# Patient Record
Sex: Female | Born: 1990 | Race: White | Hispanic: No | Marital: Single | Smoking: Current every day smoker
Health system: Southern US, Community
[De-identification: ages and names within clinical notes are randomized; demographics above are authoritative.]

## PROBLEM LIST (undated history)

## (undated) DIAGNOSIS — F1120 Opioid dependence, uncomplicated: Secondary | ICD-10-CM

## (undated) DIAGNOSIS — B019 Varicella without complication: Secondary | ICD-10-CM

## (undated) DIAGNOSIS — F32A Depression, unspecified: Secondary | ICD-10-CM

## (undated) DIAGNOSIS — J45909 Unspecified asthma, uncomplicated: Secondary | ICD-10-CM

## (undated) DIAGNOSIS — F419 Anxiety disorder, unspecified: Secondary | ICD-10-CM

## (undated) DIAGNOSIS — K759 Inflammatory liver disease, unspecified: Secondary | ICD-10-CM

## (undated) DIAGNOSIS — F329 Major depressive disorder, single episode, unspecified: Secondary | ICD-10-CM

## (undated) DIAGNOSIS — N390 Urinary tract infection, site not specified: Secondary | ICD-10-CM

## (undated) DIAGNOSIS — I10 Essential (primary) hypertension: Secondary | ICD-10-CM

## (undated) DIAGNOSIS — A630 Anogenital (venereal) warts: Secondary | ICD-10-CM

## (undated) HISTORY — DX: Opioid dependence, uncomplicated: F11.20

## (undated) HISTORY — DX: Anxiety disorder, unspecified: F41.9

## (undated) HISTORY — DX: Unspecified asthma, uncomplicated: J45.909

## (undated) HISTORY — PX: OTHER SURGICAL HISTORY: SHX169

## (undated) HISTORY — DX: Urinary tract infection, site not specified: N39.0

## (undated) HISTORY — DX: Inflammatory liver disease, unspecified: K75.9

## (undated) HISTORY — DX: Essential (primary) hypertension: I10

## (undated) HISTORY — DX: Varicella without complication: B01.9

## (undated) HISTORY — DX: Depression, unspecified: F32.A

## (undated) HISTORY — DX: Major depressive disorder, single episode, unspecified: F32.9

## (undated) HISTORY — DX: Anogenital (venereal) warts: A63.0

---

## 2014-03-21 ENCOUNTER — Encounter: Payer: Self-pay | Admitting: Family

## 2014-03-25 ENCOUNTER — Ambulatory Visit: Payer: BLUE CROSS/BLUE SHIELD | Admitting: Family

## 2014-04-01 ENCOUNTER — Ambulatory Visit (INDEPENDENT_AMBULATORY_CARE_PROVIDER_SITE_OTHER): Payer: BLUE CROSS/BLUE SHIELD | Admitting: Family

## 2014-04-01 ENCOUNTER — Encounter: Payer: Self-pay | Admitting: Family

## 2014-04-01 VITALS — BP 130/98 | HR 110 | Temp 98.3°F | Resp 18 | Ht 62.0 in | Wt 169.1 lb

## 2014-04-01 DIAGNOSIS — L709 Acne, unspecified: Secondary | ICD-10-CM

## 2014-04-01 DIAGNOSIS — F5105 Insomnia due to other mental disorder: Secondary | ICD-10-CM

## 2014-04-01 DIAGNOSIS — F411 Generalized anxiety disorder: Secondary | ICD-10-CM

## 2014-04-01 DIAGNOSIS — F419 Anxiety disorder, unspecified: Secondary | ICD-10-CM | POA: Insufficient documentation

## 2014-04-01 DIAGNOSIS — K580 Irritable bowel syndrome with diarrhea: Secondary | ICD-10-CM | POA: Insufficient documentation

## 2014-04-01 MED ORDER — ALPRAZOLAM 0.5 MG PO TABS: 0.5000 mg | ORAL_TABLET | Freq: Two times a day (BID) | ORAL | Status: DC | PRN

## 2014-04-01 MED ORDER — ELUXADOLINE 100 MG PO TABS: 100.0000 mg | ORAL_TABLET | Freq: Two times a day (BID) | ORAL | Status: DC

## 2014-04-01 NOTE — Assessment & Plan Note (Signed)
Symptoms and exam consistent with generalized anxiety disorder which remains uncontrolled at this time. Patient is unsure of previous medications which she has tried. Start Xanax as needed. Follow-up in one month to determine anxiety control.

## 2014-04-01 NOTE — Progress Notes (Signed)
Pre visit review using our clinic review tool, if applicable. No additional management support is needed unless otherwise documented below in the visit note. 

## 2014-04-01 NOTE — Assessment & Plan Note (Signed)
Patient with history of acne and would like referral to dermatology. Referral placed.

## 2014-04-01 NOTE — Assessment & Plan Note (Signed)
Insomnia secondary to anxiety related to her racing mind. Treat with the same Xanax as prescribed for general anxiety disorder. Follow-up in one month.

## 2014-04-01 NOTE — Assessment & Plan Note (Signed)
Patient with previous history of irritable bowel syndrome with diarrhea. Patient indicates that she has tried all over-the-counter medications which provided no relief. She has a referral to gastroenterology made by her GYN. Start probiotic. Start eluxadoline as trial for IBS-D. Follow with GI or in this office as needed.

## 2014-04-01 NOTE — Patient Instructions (Addendum)
Thank you for choosing [facility].  Summary/Instructions:  Your prescription(s) have been submitted to your pharmacy or been printed and provided for you. Please take as directed and contact our office if you believe you are having problem(s) with the medication(s) or have any questions.  If your symptoms worsen or fail to improve, please contact our office for further instruction, or in case of emergency go directly to the emergency room at the closest medical facility.    Recommendations for improving sleep:   Avoid having pets sleep in the bedroom  Avoid caffeine consumption after 4pm  Keep bedroom cool and conducive to sleep  Avoid nicotine use, especially in the evening  Avoid exercise within 2-3 hours before bedtime  Stimulus Control:   Go to bed only when sleepy  Use the bedroom for sleep and sex only  Go to another room if you are unable to fall asleep within 15 to 20 minutes  Read or engage in other quiet activities and return to bed only when sleepy. 

## 2014-04-01 NOTE — Progress Notes (Signed)
Subjective:    Patient ID: Joyce Rivas, female    DOB: [birth year], 24 y.o.   MRN: [id number]  Chief Complaint  Patient presents with  . Establish Care    says she has anxiety, and insomnia, and IBS-D    HPI:  Joyce Rivas is a 24 y.o. female who presents today    1) Anxiety - Diagnosed with anxiety as a teenager and continues to experience the associated symptoms of anxiety and panic attacks. Indicates that her anxiety has increased lately secondary to positive life changes. Has previously been tried on Xanax 0.5 mg, which helped a lot. Does not recall which medications have been trialed.   2) Insomnia - Associated symptom of mind racing and anxiety keeps her up at night. This has been going on for years. Has tried melatonin which helps her get to sleep but not stay asleep.    3) IBS-D - Explains a long history of IBS-D for which her GYN has referred her to GI for further management. Indicates that she continues to experience the associated symptoms of diarrhea that is refractory to over the counter medications that she has tried and stomach cramping on occasion.    No Known Allergies   No current outpatient prescriptions on file prior to visit.   No current facility-administered medications on file prior to visit.    Past Medical History  Diagnosis Date  . Anxiety   . Depression   . Asthma   . Heroin addiction   . Chicken pox   . Genital warts   . Hepatitis   . Urinary tract infection     Past Surgical History  Procedure Laterality Date  . Wisdom teeth removal      Family History  Problem Relation Age of Onset  . Drug abuse Mother   . Kidney cancer Mother   . Bladder Cancer Mother   . Drug abuse Father   . Stroke Father   . Hypertension Father   . Arthritis Maternal Grandmother   . Diabetes Paternal Grandmother     History   Social History  . Marital Status: Single    Spouse Name: N/A  . Number of Children: 0  . Years of Education: 11   Occupational  History  . Unemployed    Social History Main Topics  . Smoking status: Former Smoker -- 1.00 packs/day for 10 years    Quit date: 02/16/2014  . Smokeless tobacco: Never Used  . Alcohol Use: 12.0 oz/week    0 Standard drinks or equivalent, 20 Cans of beer per week  . Drug Use: No  . Sexual Activity: Not on file   Other Topics Concern  . Not on file   Social History Narrative   Born in [place], [place] and raised in [place], [place]. Lives with her husband. No pets. Fun: Dance and sing.   Denies any religious beliefs effecting healthcare.     Review of Systems  Constitutional: Negative for fever, chills and appetite change.  Gastrointestinal: Positive for diarrhea. Negative for nausea, vomiting, blood in stool and abdominal distention.  Psychiatric/Behavioral: Positive for sleep disturbance. The patient is nervous/anxious.       Objective:    BP 130/98 mmHg  Pulse 110  Temp(Src) 98.3 F (36.8 C) (Oral)  Resp 18  Ht  (1.575 m)  Wt 169 lb 1.9 oz (76.712 kg)  BMI 30.92 kg/m2  SpO2 97% Nursing note and vital signs reviewed.  Physical Exam  Constitutional: She is oriented to  person, place, and time. She appears well-developed and well-nourished. No distress.  Cardiovascular: Normal rate, regular rhythm, normal heart sounds and intact distal pulses.   Pulmonary/Chest: Effort normal and breath sounds normal.  Abdominal: Normal appearance and bowel sounds are normal. She exhibits no ascites. There is no hepatosplenomegaly. There is no tenderness. There is no rigidity, no rebound, no guarding, no CVA tenderness, no tenderness at McBurney's point and negative Murphy's sign.  Neurological: She is alert and oriented to person, place, and time.  Skin: Skin is warm and dry.  Psychiatric: She has a normal mood and affect. Her behavior is normal. Judgment and thought content normal.       Assessment & Plan:

## 2014-04-16 ENCOUNTER — Other Ambulatory Visit: Payer: Self-pay | Admitting: Internal Medicine

## 2014-04-29 ENCOUNTER — Ambulatory Visit (INDEPENDENT_AMBULATORY_CARE_PROVIDER_SITE_OTHER): Payer: BLUE CROSS/BLUE SHIELD | Admitting: Family

## 2014-04-29 ENCOUNTER — Encounter: Payer: Self-pay | Admitting: Family

## 2014-04-29 VITALS — BP 128/88 | HR 112 | Temp 97.5°F | Resp 18 | Wt 165.0 lb

## 2014-04-29 DIAGNOSIS — B001 Herpesviral vesicular dermatitis: Secondary | ICD-10-CM | POA: Diagnosis not present

## 2014-04-29 DIAGNOSIS — F411 Generalized anxiety disorder: Secondary | ICD-10-CM

## 2014-04-29 MED ORDER — ALBUTEROL SULFATE HFA 108 (90 BASE) MCG/ACT IN AERS: 2.0000 | INHALATION_SPRAY | Freq: Four times a day (QID) | RESPIRATORY_TRACT | Status: DC | PRN

## 2014-04-29 MED ORDER — LIDOCAINE VISCOUS 2 % MT SOLN: 20.0000 mL | OROMUCOSAL | Status: DC | PRN

## 2014-04-29 MED ORDER — ALPRAZOLAM 1 MG PO TABS: 0.5000 mg | ORAL_TABLET | Freq: Two times a day (BID) | ORAL | Status: DC | PRN

## 2014-04-29 MED ORDER — VENLAFAXINE HCL ER 37.5 MG PO CP24: 37.5000 mg | ORAL_CAPSULE | Freq: Every day | ORAL | Status: DC

## 2014-04-29 MED ORDER — VALACYCLOVIR HCL 1 G PO TABS: 1000.0000 mg | ORAL_TABLET | Freq: Two times a day (BID) | ORAL | Status: DC

## 2014-04-29 NOTE — Assessment & Plan Note (Signed)
Anxiety appears to be exacerbated by patient's current situation and previous history. Denies suicidal ideations. Increase Xanax to 1 mg twice a day as needed. Start venlafaxine daily. Follow-up in one month.

## 2014-04-29 NOTE — Progress Notes (Signed)
Pre visit review using our clinic review tool, if applicable. No additional management support is needed unless otherwise documented below in the visit note. 

## 2014-04-29 NOTE — Progress Notes (Addendum)
   Subjective:    Patient ID: Joyce Rivas, female    DOB: Oct 07, 1990, 24 y.o.   MRN: [id number]  Chief Complaint  Patient presents with  . Mouth Lesions    multiple cold sores, using OTC with no relief    HPI:  Joyce Rivas is a 24 y.o. female who presents today for an acute visit.   1) Cold Sores - This is a new problem. Associated symptom of cold sore located in her mouth on the posterior aspect of her lip has been going on for about week. She has tried OTC medication which has provided some relief but is not doing a lot. Notes that when she took the lip piercing out noticed there was pus coming out.   2) Anxiety - Previously started on 0.5 mg of Xanax which she indicates does help however she indicates that her anxiety has worsened and she has been more tearful lately. She would like to increase her xanax and a referral to psychology.    No Known Allergies  Current Outpatient Prescriptions on File Prior to Visit  Medication Sig Dispense Refill  . albuterol (PROVENTIL) (2.5 MG/3ML) 0.083% nebulizer solution Take 2.5 mg by nebulization every 6 (six) hours as needed for wheezing or shortness of breath.    . ALPRAZolam (XANAX) 0.5 MG tablet Take 1 tablet (0.5 mg total) by mouth 2 (two) times daily as needed for anxiety. 60 tablet 0  . flurbiprofen (ANSAID) 100 MG tablet TAKE 1 TWICE DAILY UNTIL COMPLETE 20 tablet 0  . Eluxadoline (VIBERZI) 100 MG TABS Take 100 mg by mouth 2 (two) times daily. 60 tablet 2   No current facility-administered medications on file prior to visit.     Review of Systems  Constitutional: Negative for fever and chills.  HENT: Positive for mouth sores.   Psychiatric/Behavioral: Positive for dysphoric mood. Negative for suicidal ideas. The patient is nervous/anxious.       Objective:    BP 128/88 mmHg  Pulse 112  Temp(Src) 97.5 F (36.4 C) (Oral)  Resp 18  Wt 165 lb (74.844 kg)  SpO2 99% Nursing note and vital signs reviewed.  Physical Exam    Constitutional: She is oriented to person, place, and time. She appears well-developed and well-nourished. No distress.  HENT:  Half inch diameter oval, appears raw with cracking, reddened at the base on the posterior aspect of her lip  Cardiovascular: Normal rate, regular rhythm, normal heart sounds and intact distal pulses.   Pulmonary/Chest: Effort normal and breath sounds normal.  Neurological: She is alert and oriented to person, place, and time.  Skin: Skin is warm and dry.  Psychiatric: Her speech is normal and behavior is normal. Judgment and thought content normal. Her mood appears anxious. She exhibits a depressed mood.       Assessment & Plan:

## 2014-04-29 NOTE — Assessment & Plan Note (Signed)
Symptoms and exam consistent with cold sore. No other evidence of inflammation at present. Start lidocaine solution as needed for pain. Cannot rule out potential HSV-1. Patient indicates her GYN has already tested but she is unsure of the results. Prescription for Valtrex given if needed.

## 2014-04-29 NOTE — Patient Instructions (Signed)
Thank you for choosing [facility].  Summary/Instructions:  Your prescription(s) have been submitted to your pharmacy or been printed and provided for you. Please take as directed and contact our office if you believe you are having problem(s) with the medication(s) or have any questions.  If your symptoms worsen or fail to improve, please contact our office for further instruction, or in case of emergency go directly to the emergency room at the closest medical facility.     

## 2014-05-13 ENCOUNTER — Ambulatory Visit (INDEPENDENT_AMBULATORY_CARE_PROVIDER_SITE_OTHER): Payer: BLUE CROSS/BLUE SHIELD | Admitting: Family

## 2014-05-13 ENCOUNTER — Encounter: Payer: Self-pay | Admitting: Family

## 2014-05-13 ENCOUNTER — Telehealth: Payer: Self-pay | Admitting: Family

## 2014-05-13 VITALS — BP 140/98 | HR 110 | Temp 97.6°F | Resp 18 | Ht 62.0 in | Wt 164.1 lb

## 2014-05-13 DIAGNOSIS — F411 Generalized anxiety disorder: Secondary | ICD-10-CM | POA: Diagnosis not present

## 2014-05-13 DIAGNOSIS — K580 Irritable bowel syndrome with diarrhea: Secondary | ICD-10-CM | POA: Diagnosis not present

## 2014-05-13 DIAGNOSIS — B001 Herpesviral vesicular dermatitis: Secondary | ICD-10-CM | POA: Diagnosis not present

## 2014-05-13 MED ORDER — ACYCLOVIR 400 MG PO TABS: 400.0000 mg | ORAL_TABLET | Freq: Two times a day (BID) | ORAL | Status: DC

## 2014-05-13 MED ORDER — ALPRAZOLAM 1 MG PO TABS: 1.0000 mg | ORAL_TABLET | Freq: Two times a day (BID) | ORAL | Status: DC | PRN

## 2014-05-13 MED ORDER — ELUXADOLINE 100 MG PO TABS: 100.0000 mg | ORAL_TABLET | Freq: Two times a day (BID) | ORAL | Status: DC

## 2014-05-13 NOTE — Telephone Encounter (Signed)
Ok to refill 

## 2014-05-13 NOTE — Progress Notes (Signed)
Pre visit review using our clinic review tool, if applicable. No additional management support is needed unless otherwise documented below in the visit note. 

## 2014-05-13 NOTE — Assessment & Plan Note (Signed)
Patient continues to experience some diarrhea from her irritable bowel syndrome. Prescription for Viberzi provided to patient. Follow up in 1 month or sooner if needed.

## 2014-05-13 NOTE — Assessment & Plan Note (Signed)
Patient continues to experience anxiety and an unsuccessful trial of Effexor. Discontinue Effexor. Continue Xanax at this time. Patient wishes to see psychiatry to discuss her anxiety and depression and for also possible other trials of medication. Controlled substance contract signed. Urine drug screen performed with results pending. Follow-up in one month or sooner if needed.

## 2014-05-13 NOTE — Progress Notes (Signed)
   Subjective:    Patient ID: Joyce Rivas, female    DOB: 1990-05-16, 24 y.o.   MRN: [id number]  Chief Complaint  Patient presents with  . Medication follow up    Referral to psychiatrist, wants to know if she can get a refill of xanax and put on a daily of medication for cold sores, wants IBS medicine, low energy     HPI:  Joyce Rivas is a 24 y.o. female who presents today for medication follow up.  1) Irritable bowel syndrome - previously prescribed Viberzi for her IBS-D. Secondary to mixup patient was unable to obtain the medication. Indicates that her IBS has been improved , however would like to try the Viberzi.  2) Anxiety - previously prescribed Effexor to assist with anxiety and depression. Patient indicates she had weird feelings on the medication and discontinued it. She continues to take the Xanax as needed for anxiety, which she indicates is just about twice a day. She is requesting referral to psychiatry and to cancel the psychology referral that was previously made. Indicates she has several things that she would like to talk to them about.  3) Sores on Mouth - completed prescription of Valtrex, which she indicates helps tremendously. Also use lidocaine solution which allowed her to eat. She would like to obtain a cyclical fever as a preventative measure as she tends to get outbreaks fairly frequently.  No Known Allergies   Current Outpatient Prescriptions on File Prior to Visit  Medication Sig Dispense Refill  . albuterol (PROVENTIL HFA;VENTOLIN HFA) 108 (90 BASE) MCG/ACT inhaler Inhale 2 puffs into the lungs every 6 (six) hours as needed for wheezing or shortness of breath. 1 Inhaler 10  . flurbiprofen (ANSAID) 100 MG tablet TAKE 1 TWICE DAILY UNTIL COMPLETE 20 tablet 0  . lidocaine (XYLOCAINE) 2 % solution Use as directed 20 mLs in the mouth or throat as needed for mouth pain. 100 mL 0  . Omeprazole (PRILOSEC PO) Take by mouth daily.    . ranitidine (ZANTAC) 150 MG tablet  Take 150 mg by mouth 2 (two) times daily.     No current facility-administered medications on file prior to visit.    Review of Systems  Constitutional: Negative for fever and chills.  HENT: Negative for mouth sores.   Gastrointestinal: Positive for diarrhea. Negative for abdominal pain.  Psychiatric/Behavioral: Positive for decreased concentration. Negative for dysphoric mood. The patient is nervous/anxious.       Objective:    BP 140/98 mmHg  Pulse 110  Temp(Src) 97.6 F (36.4 C) (Oral)  Resp 18  Ht 5\' 2"  (1.575 m)  Wt 164 lb 1.9 oz (74.444 kg)  BMI 30.01 kg/m2  SpO2 99% Nursing note and vital signs reviewed.  Physical Exam  Constitutional: She is oriented to person, place, and time. She appears well-developed and well-nourished. No distress.  Cardiovascular: Normal rate, regular rhythm, normal heart sounds and intact distal pulses.   Pulmonary/Chest: Effort normal and breath sounds normal.  Abdominal: Soft. Bowel sounds are normal. She exhibits no distension and no mass. There is no tenderness. There is no rebound and no guarding.  Neurological: She is alert and oriented to person, place, and time.  Skin: Skin is warm and dry.  Psychiatric: Her behavior is normal. Judgment and thought content normal. Her mood appears anxious.       Assessment & Plan:

## 2014-05-13 NOTE — Telephone Encounter (Signed)
Seen in office today  

## 2014-05-13 NOTE — Patient Instructions (Signed)
Thank you for choosing [facility].  Summary/Instructions:  Your prescription(s) have been submitted to your pharmacy or been printed and provided for you. Please take as directed and contact our office if you believe you are having problem(s) with the medication(s) or have any questions.  If your symptoms worsen or fail to improve, please contact our office for further instruction, or in case of emergency go directly to the emergency room at the closest medical facility.     

## 2014-05-13 NOTE — Assessment & Plan Note (Addendum)
Cold sores resolved with Valtrex. Per patient request start acyclovir for suppressive effects. Continue with Valtrex as needed for outbreaks. Follow up if symptoms worsen or fail to improve.

## 2014-05-13 NOTE — Telephone Encounter (Signed)
Patient is requesting a refill for flurbiprofen (ANSAID) 100 MG tablet [[id number]][[id number]] sent to [facility] @ [address]

## 2014-05-27 ENCOUNTER — Telehealth: Payer: Self-pay | Admitting: Family

## 2014-05-27 NOTE — Telephone Encounter (Signed)
Patient needs refill for ALPRAZolam Joyce Rivas(XANAX) 1 MG tablet [[id number]. Does she need to come in for an appointment for this since she was just her on the 4/13?

## 2014-05-28 MED ORDER — ALPRAZOLAM 1 MG PO TABS: 1.0000 mg | ORAL_TABLET | Freq: Two times a day (BID) | ORAL | Status: DC | PRN

## 2014-05-28 NOTE — Telephone Encounter (Signed)
Medication refilled and faxed to pharmacy.  

## 2014-05-29 ENCOUNTER — Other Ambulatory Visit: Payer: Self-pay | Admitting: Family

## 2014-05-29 MED ORDER — ALPRAZOLAM 1 MG PO TABS: 1.0000 mg | ORAL_TABLET | Freq: Two times a day (BID) | ORAL | Status: DC | PRN

## 2014-06-01 ENCOUNTER — Encounter: Payer: Self-pay | Admitting: Family

## 2014-06-22 ENCOUNTER — Other Ambulatory Visit: Payer: Self-pay | Admitting: Internal Medicine

## 2014-06-23 ENCOUNTER — Telehealth: Payer: Self-pay | Admitting: Family

## 2014-06-23 NOTE — Telephone Encounter (Signed)
Pt is going out of town Friday and would like to pick her script for her ALPRAZolam Prudy Feeler(XANAX) 1 MG tablet [[id number]][[id number]] .Marland Kitchen.  They always have to call for an override which delays it. She said that she needs no later then Friday

## 2014-06-23 NOTE — Telephone Encounter (Signed)
Faxed

## 2014-06-23 NOTE — Telephone Encounter (Signed)
[facility]ed [facility].../lmb 

## 2014-07-22 ENCOUNTER — Other Ambulatory Visit: Payer: Self-pay | Admitting: Internal Medicine

## 2014-07-22 MED ORDER — ALPRAZOLAM 1 MG PO TABS: 1.0000 mg | ORAL_TABLET | Freq: Two times a day (BID) | ORAL | Status: DC | PRN

## 2014-07-22 NOTE — Telephone Encounter (Signed)
Joyce Rivas, your patient. [employer]

## 2014-07-22 NOTE — Telephone Encounter (Signed)
Please advise, thanks.

## 2014-08-05 ENCOUNTER — Telehealth: Payer: Self-pay | Admitting: Family

## 2014-08-05 NOTE — Telephone Encounter (Signed)
Just FYI - Received call from Dawn @ [facility] stating they have tried to call patient multiple times with no response from patient. They are mailing a letter to pt. °

## 2014-08-05 NOTE — Telephone Encounter (Signed)
Noted  

## 2014-08-21 ENCOUNTER — Telehealth: Payer: Self-pay

## 2014-08-21 MED ORDER — ALPRAZOLAM 1 MG PO TABS: 1.0000 mg | ORAL_TABLET | Freq: Two times a day (BID) | ORAL | Status: DC | PRN

## 2014-08-21 NOTE — Telephone Encounter (Signed)
Requesting refill for xanax 1 mg

## 2014-08-21 NOTE — Telephone Encounter (Signed)
Medication printed and faxed. Please inform patient OV needed for next refill.

## 2014-08-24 NOTE — Telephone Encounter (Signed)
LVM letting pt know.  

## 2014-08-31 ENCOUNTER — Other Ambulatory Visit: Payer: Self-pay | Admitting: Family

## 2014-09-04 ENCOUNTER — Telehealth: Payer: Self-pay | Admitting: *Deleted

## 2014-09-04 MED ORDER — ELUXADOLINE 100 MG PO TABS: 1.0000 | ORAL_TABLET | Freq: Two times a day (BID) | ORAL | Status: DC

## 2014-09-04 NOTE — Telephone Encounter (Signed)
Pt states [facility] is [facility]. [facility]form [facility].../lmb

## 2014-09-09 ENCOUNTER — Telehealth: Payer: Self-pay | Admitting: *Deleted

## 2014-09-09 MED ORDER — ELUXADOLINE 100 MG PO TABS: 1.0000 | ORAL_TABLET | Freq: Two times a day (BID) | ORAL | Status: DC

## 2014-09-09 NOTE — Telephone Encounter (Signed)
Prescription written and will be faxed.

## 2014-09-09 NOTE — Telephone Encounter (Signed)
Notified pt Joyce Rivas script [facility].../lmb

## 2014-09-09 NOTE — Telephone Encounter (Signed)
Pt states her insurance will only allow her to get a 90 day on her meds. Requesting new script for 90 day on her [facility]rez [facility]

## 2014-09-24 ENCOUNTER — Other Ambulatory Visit: Payer: Self-pay | Admitting: Family

## 2014-09-24 NOTE — Telephone Encounter (Signed)
[facility]ed [facility] bck to [facility]...Georgiann Mccoy

## 2014-09-24 NOTE — Telephone Encounter (Signed)
Medication refilled

## 2014-10-28 ENCOUNTER — Other Ambulatory Visit: Payer: Self-pay | Admitting: Internal Medicine

## 2014-10-29 NOTE — Telephone Encounter (Signed)
Last OV 05/13/14. Please advise.

## 2014-10-29 NOTE — Telephone Encounter (Signed)
Needs office visit please

## 2014-11-02 ENCOUNTER — Other Ambulatory Visit: Payer: Self-pay | Admitting: Family

## 2014-11-02 MED ORDER — ALPRAZOLAM 1 MG PO TABS: 1.0000 mg | ORAL_TABLET | Freq: Two times a day (BID) | ORAL | Status: DC | PRN

## 2014-11-09 ENCOUNTER — Ambulatory Visit: Payer: BLUE CROSS/BLUE SHIELD | Admitting: Family

## 2014-11-09 DIAGNOSIS — Z0289 Encounter for other administrative examinations: Secondary | ICD-10-CM

## 2014-11-10 ENCOUNTER — Ambulatory Visit (INDEPENDENT_AMBULATORY_CARE_PROVIDER_SITE_OTHER): Payer: PRIVATE HEALTH INSURANCE | Admitting: Family

## 2014-11-10 ENCOUNTER — Encounter: Payer: Self-pay | Admitting: Family

## 2014-11-10 VITALS — BP 112/80 | HR 101 | Temp 98.1°F | Resp 18 | Ht 62.0 in | Wt 151.8 lb

## 2014-11-10 DIAGNOSIS — F411 Generalized anxiety disorder: Secondary | ICD-10-CM

## 2014-11-10 DIAGNOSIS — K120 Recurrent oral aphthae: Secondary | ICD-10-CM | POA: Insufficient documentation

## 2014-11-10 DIAGNOSIS — K580 Irritable bowel syndrome with diarrhea: Secondary | ICD-10-CM | POA: Diagnosis not present

## 2014-11-10 MED ORDER — ELUXADOLINE 100 MG PO TABS: 1.0000 | ORAL_TABLET | Freq: Two times a day (BID) | ORAL | Status: DC

## 2014-11-10 MED ORDER — LIDOCAINE VISCOUS 2 % MT SOLN: 20.0000 mL | OROMUCOSAL | Status: DC | PRN

## 2014-11-10 MED ORDER — ACYCLOVIR 400 MG PO TABS: 400.0000 mg | ORAL_TABLET | Freq: Two times a day (BID) | ORAL | Status: DC

## 2014-11-10 MED ORDER — ALPRAZOLAM 1 MG PO TABS: 1.0000 mg | ORAL_TABLET | Freq: Two times a day (BID) | ORAL | Status: DC | PRN

## 2014-11-10 NOTE — Progress Notes (Signed)
Pre visit review using our clinic review tool, if applicable. No additional management support is needed unless otherwise documented below in the visit note. 

## 2014-11-10 NOTE — Assessment & Plan Note (Signed)
Anxiety is stable with current regimen of Xanax and Topamax. Established with psychiatry and psychology. Reports stressors are well controlled. Continue current dosage of Xanax and follow up with Behavioral Health as scheduled.

## 2014-11-10 NOTE — Progress Notes (Signed)
Subjective:    Patient ID: Joyce Rivas, female    DOB: 1990/05/01, 24 y.o.   MRN: [id number]  Chief Complaint  Patient presents with  . Medication Management    thinks she has an infection in her mouth and would like an antibiotic if possible, refill for xanax    HPI:  Joyce Rivas is a 24 y.o. female who  has a past medical history of Anxiety; Depression; Asthma; Heroin addiction (HCC); Chicken pox; Genital warts; Hepatitis; and Urinary tract infection. and presents today for a follow up office visit.   1.) Anxiety - Anxiety is well controlled with Xanax. Also being seen by a psychiatrist (Dr. Manson Passey, - [facility]). Takes the medication as prescribed and denies adverse side effects. Has also been started on Topamax. Currently takes the Xanax twice daily.   2.) IBS - Currently maintained on Viberzi which she indicates adequately controls her symptoms in combination with her diet. Has lost about 15 pounds since last visit and reports she is feeling better. Takes one pill daily and denies adverse side effects.  Wt Readings from Last 3 Encounters:  11/10/14 151 lb 12.8 oz (68.856 kg)  05/13/14 164 lb 1.9 oz (74.444 kg)  04/29/14 165 lb (74.844 kg)    3.) Oral infection - Associated symptom of a cold sore has been going on for about 1 week. Modifying factors include the Acyclovir which helped to improve it. Continues to experience gum bleeding. Described as sores in her mouth.    No Known Allergies   Current Outpatient Prescriptions on File Prior to Visit  Medication Sig Dispense Refill  . albuterol (PROVENTIL HFA;VENTOLIN HFA) 108 (90 BASE) MCG/ACT inhaler Inhale 2 puffs into the lungs every 6 (six) hours as needed for wheezing or shortness of breath. 1 Inhaler 10  . flurbiprofen (ANSAID) 100 MG tablet TAKE 1 TWICE DAILY UNTIL COMPLETE 20 tablet 0  . Omeprazole (PRILOSEC PO) Take by mouth daily.    . ranitidine (ZANTAC) 150 MG tablet Take 150 mg by mouth 2  (two) times daily.     No current facility-administered medications on file prior to visit.    Past Medical History  Diagnosis Date  . Anxiety   . Depression   . Asthma   . Heroin addiction (HCC)   . Chicken pox   . Genital warts   . Hepatitis   . Urinary tract infection      Past Surgical History  Procedure Laterality Date  . Wisdom teeth removal       Review of Systems  Constitutional: Negative for fever and chills.  Gastrointestinal: Negative for nausea, vomiting, abdominal pain, diarrhea and constipation.  Psychiatric/Behavioral: Negative for sleep disturbance, dysphoric mood and decreased concentration. The patient is not nervous/anxious.       Objective:    BP 112/80 mmHg  Pulse 101  Temp(Src) 98.1 F (36.7 C) (Oral)  Resp 18  Ht  (1.575 m)  Wt 151 lb 12.8 oz (68.856 kg)  BMI 27.76 kg/m2  SpO2 98% Nursing note and vital signs reviewed.  Physical Exam  Constitutional: She is oriented to person, place, and time. She appears well-developed and well-nourished. No distress.  HENT:  3 distinct white annular areas located on the upper left anterior aspect of her gums with mild red bases and no discharge present.   Cardiovascular: Normal rate, regular rhythm, normal heart sounds and intact distal pulses.   Pulmonary/Chest: Effort normal and breath sounds normal.  Neurological: She is  alert and oriented to person, place, and time.  Skin: Skin is warm and dry.  Psychiatric: She has a normal mood and affect. Her behavior is normal. Judgment and thought content normal.       Assessment & Plan:   Problem List Items Addressed This Visit      Digestive   Irritable bowel syndrome with diarrhea    Well controlled with current regimen of once daily Viberzi. Denies adverse side effects. Continue current dosage of Viberzi.       Relevant Medications   Eluxadoline (VIBERZI) 100 MG TABS   Canker sore - Primary    Symptoms and exam consistent with canker sores.  Continue conservative treatment with lidocaine as needed. Follow up if symptoms worsen or fail to improve.       Relevant Medications   lidocaine (XYLOCAINE) 2 % solution     Other   Generalized anxiety disorder    Anxiety is stable with current regimen of Xanax and Topamax. Established with psychiatry and psychology. Reports stressors are well controlled. Continue current dosage of Xanax and follow up with Behavioral Health as scheduled.       Relevant Medications   ALPRAZolam (XANAX) 1 MG tablet

## 2014-11-10 NOTE — Patient Instructions (Signed)
Thank you for choosing [facility].  Summary/Instructions:  Continue to take your medications as prescribed.  Your prescription(s) have been submitted to your pharmacy or been printed and provided for you. Please take as directed and contact our office if you believe you are having problem(s) with the medication(s) or have any questions.  If your symptoms worsen or fail to improve, please contact our office for further instruction, or in case of emergency go directly to the emergency room at the closest medical facility.     

## 2014-11-10 NOTE — Assessment & Plan Note (Signed)
Well controlled with current regimen of once daily Viberzi. Denies adverse side effects. Continue current dosage of Viberzi.

## 2014-11-10 NOTE — Assessment & Plan Note (Signed)
Symptoms and exam consistent with canker sores. Continue conservative treatment with lidocaine as needed. Follow up if symptoms worsen or fail to improve.

## 2015-01-29 ENCOUNTER — Telehealth: Payer: Self-pay | Admitting: Family

## 2015-01-29 DIAGNOSIS — F411 Generalized anxiety disorder: Secondary | ICD-10-CM

## 2015-01-29 MED ORDER — ALPRAZOLAM 1 MG PO TABS: 1.0000 mg | ORAL_TABLET | Freq: Two times a day (BID) | ORAL | Status: DC | PRN

## 2015-01-29 NOTE — Telephone Encounter (Signed)
Please advise. Last refill was 10/11

## 2015-01-29 NOTE — Telephone Encounter (Signed)
Medication refilled and faxed.  

## 2015-01-29 NOTE — Telephone Encounter (Signed)
Pt called [facility] Please help, pt was trying to get this done before the weekend. Please help

## 2015-03-01 ENCOUNTER — Other Ambulatory Visit: Payer: Self-pay | Admitting: Family

## 2015-03-01 NOTE — Telephone Encounter (Signed)
Last refill was 01/29/15 

## 2015-03-31 ENCOUNTER — Other Ambulatory Visit: Payer: Self-pay | Admitting: Family

## 2015-03-31 NOTE — Telephone Encounter (Signed)
Please advise, thanks.

## 2015-04-07 ENCOUNTER — Other Ambulatory Visit: Payer: Self-pay

## 2015-04-07 ENCOUNTER — Telehealth: Payer: Self-pay

## 2015-04-07 MED ORDER — ACYCLOVIR 400 MG PO TABS: 400.0000 mg | ORAL_TABLET | Freq: Two times a day (BID) | ORAL | Status: DC

## 2015-04-07 NOTE — Telephone Encounter (Signed)
Ok to refill for 30 days and then office visit is needed.

## 2015-04-07 NOTE — Telephone Encounter (Signed)
i recd faxed rx request for acyclovir 400 mg tab----are you ok with another refill or do you need OV---patient saw you in oct/2016 and you gave 5 refills then---was just thinking patient might need follow if she is requesting more refills at this point---please advise, thanks

## 2015-04-07 NOTE — Telephone Encounter (Signed)
rx sent to patient pharm

## 2015-04-30 ENCOUNTER — Other Ambulatory Visit: Payer: Self-pay | Admitting: Family

## 2015-04-30 NOTE — Telephone Encounter (Signed)
[facility] 

## 2015-05-05 ENCOUNTER — Telehealth: Payer: Self-pay | Admitting: Family

## 2015-05-05 MED ORDER — ALPRAZOLAM 1 MG PO TABS: ORAL_TABLET | ORAL | Status: DC
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
## 2015-05-05 NOTE — Telephone Encounter (Signed)
Please advise 
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
## 2015-05-05 NOTE — Addendum Note (Signed)
Addended by: Jeanine LuzALONE, GREGORY D on: 05/05/2015 05:24 PM   Modules accepted: Orders

## 2015-05-05 NOTE — Telephone Encounter (Signed)
Medication printed and faxed to [facility].

## 2015-05-06 NOTE — Telephone Encounter (Signed)
Called pharmacy and gave ok to refill early.

## 2015-05-06 NOTE — Telephone Encounter (Signed)
Can you call [facility] they need verificaton  from greg that it is ok to fill early

## 2015-05-10 ENCOUNTER — Ambulatory Visit (INDEPENDENT_AMBULATORY_CARE_PROVIDER_SITE_OTHER): Payer: BLUE CROSS/BLUE SHIELD | Admitting: Family

## 2015-05-10 ENCOUNTER — Encounter: Payer: Self-pay | Admitting: Family

## 2015-05-10 VITALS — BP 108/68 | HR 105 | Temp 98.1°F | Resp 18 | Ht 62.0 in | Wt 147.8 lb

## 2015-05-10 DIAGNOSIS — K580 Irritable bowel syndrome with diarrhea: Secondary | ICD-10-CM | POA: Diagnosis not present

## 2015-05-10 DIAGNOSIS — F411 Generalized anxiety disorder: Secondary | ICD-10-CM | POA: Diagnosis not present

## 2015-05-10 NOTE — Assessment & Plan Note (Signed)
Symptoms of irritable bowel syndrome are well maintained with current dosage of eluxadoline without adverse side effects or abdominal pain. Continue current dosage of eluxadoline. Follow up if symptoms are no longer controlled or worsen.
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
## 2015-05-10 NOTE — Progress Notes (Signed)
Pre visit review using our clinic review tool, if applicable. No additional management support is needed unless otherwise documented below in the visit note. 
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
## 2015-05-10 NOTE — Patient Instructions (Addendum)
Thank you for choosing [facility].  Summary/Instructions:  Please continue to take your medication as prescribed. Follow up in 6 months or sooner if needed.   Your prescription(s) have been submitted to your pharmacy or been printed and provided for you. Please take as directed and contact our office if you believe you are having problem(s) with the medication(s) or have any questions.  If your symptoms worsen or fail to improve, please contact our office for further instruction, or in case of emergency go directly to the emergency room at the closest medical facility.     

## 2015-05-12 ENCOUNTER — Ambulatory Visit: Payer: PRIVATE HEALTH INSURANCE | Admitting: Family

## 2015-06-04 ENCOUNTER — Other Ambulatory Visit: Payer: Self-pay | Admitting: Family

## 2015-06-04 NOTE — Telephone Encounter (Signed)
Last refill was 05/05/15

## 2015-06-07 NOTE — Telephone Encounter (Signed)
[facility]...lmb 

## 2015-06-07 NOTE — Telephone Encounter (Signed)
Received call pt requesting status of  Refill...Joyce Rivas/lmb

## 2015-06-11 ENCOUNTER — Emergency Department (HOSPITAL_COMMUNITY)
Admission: EM | Admit: 2015-06-11 | Discharge: 2015-06-11 | Disposition: A | Discharge status: Home / Self Care | Payer: BLUE CROSS/BLUE SHIELD | Attending: Emergency Medicine | Admitting: Emergency Medicine

## 2015-06-11 ENCOUNTER — Encounter (HOSPITAL_COMMUNITY): Payer: Self-pay | Admitting: Emergency Medicine

## 2015-06-11 DIAGNOSIS — Z87891 Personal history of nicotine dependence: Secondary | ICD-10-CM | POA: Insufficient documentation

## 2015-06-11 DIAGNOSIS — Z79899 Other long term (current) drug therapy: Secondary | ICD-10-CM | POA: Insufficient documentation

## 2015-06-11 DIAGNOSIS — Y9389 Activity, other specified: Secondary | ICD-10-CM | POA: Diagnosis not present

## 2015-06-11 DIAGNOSIS — F419 Anxiety disorder, unspecified: Secondary | ICD-10-CM | POA: Insufficient documentation

## 2015-06-11 DIAGNOSIS — Y9289 Other specified places as the place of occurrence of the external cause: Secondary | ICD-10-CM | POA: Diagnosis not present

## 2015-06-11 DIAGNOSIS — Z8619 Personal history of other infectious and parasitic diseases: Secondary | ICD-10-CM | POA: Insufficient documentation

## 2015-06-11 DIAGNOSIS — J45909 Unspecified asthma, uncomplicated: Secondary | ICD-10-CM | POA: Insufficient documentation

## 2015-06-11 DIAGNOSIS — Z8719 Personal history of other diseases of the digestive system: Secondary | ICD-10-CM | POA: Insufficient documentation

## 2015-06-11 DIAGNOSIS — T40601A Poisoning by unspecified narcotics, accidental (unintentional), initial encounter: Secondary | ICD-10-CM

## 2015-06-11 DIAGNOSIS — F329 Major depressive disorder, single episode, unspecified: Secondary | ICD-10-CM | POA: Insufficient documentation

## 2015-06-11 DIAGNOSIS — Y998 Other external cause status: Secondary | ICD-10-CM | POA: Diagnosis not present

## 2015-06-11 DIAGNOSIS — T401X1A Poisoning by heroin, accidental (unintentional), initial encounter: Secondary | ICD-10-CM | POA: Diagnosis not present

## 2015-06-11 DIAGNOSIS — Z87440 Personal history of urinary (tract) infections: Secondary | ICD-10-CM | POA: Insufficient documentation

## 2015-06-11 NOTE — Discharge Instructions (Signed)
Accidental Overdose °A drug overdose occurs when a chemical substance (drug or medication) is used in amounts large enough to overcome a person. This may result in severe illness or death. This is a type of poisoning. Accidental overdoses of medications or other substances come from a variety of reasons. When this happens accidentally, it is often because the person taking the substance does not know enough about what they have taken. Drugs which commonly cause overdose deaths are alcohol, psychotropic medications (medications which affect the mind), pain medications, illegal drugs (street drugs) such as cocaine and heroin, and multiple drugs taken at the same time. It may result from careless behavior (such as over-indulging at a party). Other causes of overdose may include multiple drug use, a lapse in memory, or drug use after a period of no drug use.  °Sometimes overdosing occurs because a person cannot remember if they have taken their medication.  °A common unintentional overdose in young children involves multi-vitamins containing iron. Iron is a part of the hemoglobin molecule in blood. It is used to transport oxygen to living cells. When taken in small amounts, iron allows the body to restock hemoglobin. In large amounts, it causes problems in the body. If this overdose is not treated, it can lead to death. °Never take medicines that show signs of tampering or do not seem quite right. Never take medicines in the dark or in poor lighting. Read the label and check each dose of medicine before you take it. When adults are poisoned, it happens most often through carelessness or lack of information. Taking medicines in the dark or taking medicine prescribed for someone else to treat the same type of problem is a dangerous practice. °SYMPTOMS  °Symptoms of overdose depend on the medication and amount taken. They can vary from over-activity with stimulant over-dosage, to sleepiness from depressants such as  alcohol, narcotics and tranquilizers. Confusion, dizziness, nausea and vomiting may be present. If problems are severe enough coma and death may result. °DIAGNOSIS  °Diagnosis and management are generally straightforward if the drug is known. Otherwise it is more difficult. At times, certain symptoms and signs exhibited by the patient, or blood tests, can reveal the drug in question.  °TREATMENT  °In an emergency department, most patients can be treated with supportive measures. Antidotes may be available if there has been an overdose of opioids or benzodiazepines. A rapid improvement will often occur if this is the cause of overdose. °At home or away from medical care: °· There may be no immediate problems or warning signs in children. °· Not everything works well in all cases of poisoning. °· Take immediate action. Poisons may act quickly. °· If you think someone has swallowed medicine or a household product, and the person is unconscious, having seizures (convulsions), or is not breathing, immediately call for an ambulance. °IF a person is conscious and appears to be doing OK but has swallowed a poison: °· Do not wait to see what effect the poison will have. Immediately call a poison control center (listed in the white pages of your telephone book under "Poison Control" or inside the front cover with other emergency numbers). Some poison control centers have TTY capability for the deaf. Check with your local center if you or someone in your family requires this service. °· Keep the container so you can read the label on the product for ingredients. °· Describe what, when, and how much was taken and the age and condition of the person poisoned.   Inform them if the person is vomiting, choking, drowsy, shows a change in color or temperature of skin, is conscious or unconscious, or is convulsing. °· Do not cause vomiting unless instructed by medical personnel. Do not induce vomiting or force liquids into a person who  is convulsing, unconscious, or very drowsy. °Stay calm and in control.  °· Activated charcoal also is sometimes used in certain types of poisoning and you may wish to add a supply to your emergency medicines. It is available without a prescription. Call a poison control center before using this medication. °PREVENTION  °Thousands of children die every year from unintentional poisoning. This may be from household chemicals, poisoning from carbon monoxide in a car, taking their parent's medications, or simply taking a few iron pills or vitamins with iron. Poisoning comes from unexpected sources. °· Store medicines out of the sight and reach of children, preferably in a locked cabinet. Do not keep medications in a food cabinet. Always store your medicines in a secure place. Get rid of expired medications. °· If you have children living with you or have them as occasional guests, you should have child-resistant caps on your medicine containers. Keep everything out of reach. Child proof your home. °· If you are called to the telephone or to answer the door while you are taking a medicine, take the container with you or put the medicine out of the reach of small children. °· Do not take your medication in front of children. Do not tell your child how good a medication is and how good it is for them. They may get the idea it is more of a treat. °· If you are an adult and have accidentally taken an overdose, you need to consider how this happened and what can be done to prevent it from happening again. If this was from a street drug or alcohol, determine if there is a problem that needs addressing. If you are not sure a problems exists, it is easy to talk to a professional and ask them if they think you have a problem. It is better to handle this problem in this way before it happens again and has a much worse consequence. °  °This information is not intended to replace advice given to you by your health care provider. Make  sure you discuss any questions you have with your health care provider. °  °Document Released: 04/01/2004 Document Revised: 02/06/2014 Document Reviewed: 07/06/2014 °Elsevier Interactive Patient Education ©2016 [employer] ° °Narcotic Overdose °A narcotic overdose is the misuse or overuse of a narcotic drug. A narcotic overdose can make you pass out and stop breathing. If you are not treated right away, this can cause permanent brain damage or stop your heart. Medicine may be given to reverse the effects of an overdose. If so, this medicine may bring on withdrawal symptoms. The symptoms may be abdominal cramps, throwing up (vomiting), sweating, chills, and nervousness. °Injecting narcotics can cause more problems than just an overdose. AIDS, hepatitis, and other very serious infections are transmitted by sharing needles and syringes. If you decide to quit using, there are medicines which can help you through the withdrawal period. Trying to quit all at once on your own can be uncomfortable, but not life-threatening. Call your caregiver, Narcotics Anonymous, or any drug and alcohol treatment program for further help.  °  °This information is not intended to replace advice given to you by your health care provider. Make sure you discuss any questions   you have with your health care provider. °  °Document Released: 02/24/2004 Document Revised: 02/06/2014 Document Reviewed: 07/08/2014 °Elsevier Interactive Patient Education ©2016 Elsevier Inc. ° °

## 2015-06-11 NOTE — ED Notes (Addendum)
Pt from home via EMS- Per EMS, pt was last seen 20 minutes prior to being found by BF. BF reports cyanosis, no respiration or pulse and started CPR. EMS arrived, continued CPR and was given 2 IN narcan w/o response. Pt received 2 more narcan IN and started to respond. Pt admits to snorting heroin prior to being found. Pt is A&O and on her cellphone. Dr Freida BusmanAllen at bedside

## 2015-06-11 NOTE — ED Provider Notes (Signed)
CSN: [id number]     Arrival date & time 06/11/15  1716 History   First MD Initiated Contact with Patient 06/11/15 1724     Chief Complaint  Patient presents with  . Drug Overdose     (Consider location/radiation/quality/duration/timing/severity/associated sxs/prior Treatment) HPI Comments: Patient here after intentionally ingesting heroin with the purpose of intoxication. Denies of this was a suicide attempt. Was found by her boyfriend and she was apneic and cyanotic. She received CPR and was given a total of 4 mg of Narcan intranasal by EMS. States that she has been clean for some time and this was the first time that she has used heroin. She takes Xanax daily. Denies alcohol use. She denies being short of breath at this time.  Patient is a 25 y.o. female presenting with Overdose. The history is provided by the patient and the EMS personnel.  Drug Overdose    Past Medical History  Diagnosis Date  . Anxiety   . Depression   . Asthma   . Heroin addiction (HCC)   . Chicken pox   . Genital warts   . Hepatitis   . Urinary tract infection    Past Surgical History  Procedure Laterality Date  . Wisdom teeth removal     Family History  Problem Relation Age of Onset  . Drug abuse Mother   . Kidney cancer Mother   . Bladder Cancer Mother   . Drug abuse Father   . Stroke Father   . Hypertension Father   . Arthritis Maternal Grandmother   . Diabetes Paternal Grandmother    Social History  Substance Use Topics  . Smoking status: Former Smoker -- 1.00 packs/day for 10 years    Quit date: 02/16/2014  . Smokeless tobacco: Never Used  . Alcohol Use: 12.0 oz/week    0 Standard drinks or equivalent, 20 Cans of beer per week   OB History    No data available     Review of Systems  All other systems reviewed and are negative.     Allergies  Review of patient's allergies indicates no known allergies.  Home Medications   Prior to Admission medications   Medication Sig  Start Date End Date Taking? Authorizing Provider  acyclovir (ZOVIRAX) 400 MG tablet Take 1 tablet (400 mg total) by mouth 2 (two) times daily. 04/07/15   Veryl SpeakGregory D Calone, FNP  albuterol (PROVENTIL HFA;VENTOLIN HFA) 108 (90 BASE) MCG/ACT inhaler Inhale 2 puffs into the lungs every 6 (six) hours as needed for wheezing or shortness of breath. 04/29/14   Veryl SpeakGregory D Calone, FNP  ALPRAZolam Prudy Feeler(XANAX) 1 MG tablet TAKE 1 TABLET BY MOUTH TWICE DAILY AS NEEDED FOR ANXIETY 06/07/15   Veryl SpeakGregory D Calone, FNP  Eluxadoline (VIBERZI) 100 MG TABS Take 100 mg by mouth daily.    Historical Provider, MD  topiramate (TOPAMAX) 200 MG tablet TAKE 1 TABLET BY MOUTH EVERY DAY 04/30/15   Veryl SpeakGregory D Calone, FNP   SpO2 100% Physical Exam  Constitutional: She is oriented to person, place, and time. She appears well-developed and well-nourished.  Non-toxic appearance. No distress.  HENT:  Head: Normocephalic and atraumatic.  Eyes: Conjunctivae, EOM and lids are normal. Pupils are equal, round, and reactive to light.  Neck: Normal range of motion. Neck supple. No tracheal deviation present. No thyroid mass present.  Cardiovascular: Normal rate, regular rhythm and normal heart sounds.  Exam reveals no gallop.   No murmur heard. Pulmonary/Chest: Effort normal and breath sounds normal. No stridor.  No respiratory distress. She has no decreased breath sounds. She has no wheezes. She has no rhonchi. She has no rales.  Abdominal: Soft. Normal appearance and bowel sounds are normal. She exhibits no distension. There is no tenderness. There is no rebound and no CVA tenderness.  Musculoskeletal: Normal range of motion. She exhibits no edema or tenderness.  Neurological: She is alert and oriented to person, place, and time. She has normal strength. No cranial nerve deficit or sensory deficit. GCS eye subscore is 4. GCS verbal subscore is 5. GCS motor subscore is 6.  Skin: Skin is warm and dry. No abrasion and no rash noted.  Psychiatric: She has  a normal mood and affect. Her speech is normal and behavior is normal.  Nursing note and vitals reviewed.   ED Course  Procedures (including critical care time) Labs Review Labs Reviewed - No data to display  Imaging Review No results found. I have personally reviewed and evaluated these images and lab results as part of my medical decision-making.   EKG Interpretation None      MDM   Final diagnoses:  None    Patient monitored here and she has no signs of respiratory distress. Mild tachycardic likely anxiety. Patient requesting to go home    Lorre Nick, MD 06/11/15 [phone]

## 2015-06-11 NOTE — ED Notes (Signed)
Bed: RESB Expected date:  Expected time:  Means of arrival:  Comments: EMS- heroin OD 

## 2015-09-03 ENCOUNTER — Ambulatory Visit (INDEPENDENT_AMBULATORY_CARE_PROVIDER_SITE_OTHER): Payer: Self-pay | Admitting: Nurse Practitioner

## 2015-09-03 VITALS — BP 108/72 | HR 72 | Temp 99.1°F | Ht 62.0 in | Wt 133.6 lb

## 2015-09-03 DIAGNOSIS — L237 Allergic contact dermatitis due to plants, except food: Secondary | ICD-10-CM

## 2015-09-03 MED ORDER — METHYLPREDNISOLONE ACETATE 80 MG/ML IJ SUSP
80.0000 mg | Freq: Once | INTRAMUSCULAR | Status: AC
  Administered 2015-09-03: 80 mg via INTRAMUSCULAR

## 2015-09-03 MED ORDER — PREDNISONE 10 MG (21) PO TBPK: ORAL_TABLET | ORAL | 0 refills | Status: DC

## 2015-09-03 NOTE — Patient Instructions (Addendum)

## 2015-09-03 NOTE — Progress Notes (Signed)
Subjective:     Joyce Rivas is a 25 y.o. female who presents for evaluation of a rash involving the ear, face, hand and chest and lower back.. Rash started 1 day ago. The rash is linear and erythematous, and raised in texture. Rash has spread changed over time. Rash is pruritic. Associated symptoms: nausea and low grade temperature. Patient denies: abdominal pain, congestion, cough, decrease in appetite, decrease in energy level, sore throat and vomiting. Patient has had contacts with similar rash. Patient has had new exposures (soaps, lotions, laundry detergents, foods, medications, plants, insects or animals).  The following portions of the patient's history were reviewed and updated as appropriate: past family history, past medical history, past social history, past surgical history and problem list.  Review of Systems Pertinent items noted in HPI and remainder of comprehensive ROS otherwise negative.    Objective:    BP 108/72 (BP Location: Right Arm, Patient Position: Sitting, Cuff Size: Normal)   Pulse 72   Temp 99.1 F (37.3 C) (Oral)   Ht 5\' 2"  (1.575 m)   Wt 133 lb 9.6 oz (60.6 kg)   BMI 24.44 kg/m  General:  alert, cooperative and mild distress  Skin:  erythema noted on face, trunk and ears, chest, upper extremities and neck.     Assessment:    contact dermatitis: plants poison ivy    Plan:    The patient was prescribed a Prednisone Tapered Dose Pack.  Verbal and written discharge instructions provided to patient to include use of OTC antihistamines and oatmeal bath for comfort.  The patient was instructed to wash all clothing, bedding, etc that was exposed.  The patient will follow up with PCP if no improvement in 3-5 days.

## 2015-09-15 ENCOUNTER — Encounter: Payer: Self-pay | Admitting: Family

## 2015-09-15 ENCOUNTER — Ambulatory Visit (INDEPENDENT_AMBULATORY_CARE_PROVIDER_SITE_OTHER): Payer: BLUE CROSS/BLUE SHIELD | Admitting: Family

## 2015-09-15 DIAGNOSIS — L237 Allergic contact dermatitis due to plants, except food: Secondary | ICD-10-CM | POA: Diagnosis not present

## 2015-09-15 MED ORDER — PREDNISONE 20 MG PO TABS: ORAL_TABLET | ORAL | 0 refills | Status: DC

## 2015-09-15 NOTE — Patient Instructions (Signed)
Thank you for choosing [facility].  Summary/Instructions:  Please take the prednisone.   Your prescription(s) have been submitted to your pharmacy or been printed and provided for you. Please take as directed and contact our office if you believe you are having problem(s) with the medication(s) or have any questions.  If your symptoms worsen or fail to improve, please contact our office for further instruction, or in case of emergency go directly to the emergency room at the closest medical facility.

## 2015-09-15 NOTE — Assessment & Plan Note (Signed)
Rash consistent with possible rebound from previous poison ivy although cannot fully rule out possibility of scabies given the locations of lesions.  Start prednisone taper to treat for posion ivy and follow up if symptoms do not improve.

## 2015-09-15 NOTE — Progress Notes (Signed)
Subjective:    Patient ID: Joyce Rivas, female    DOB: 27-Nov-1990, 25 y.o.   MRN: [id number]  Chief Complaint  Patient presents with  . Rash    since she had poison ivy she has been getting welps on her hands that itch that pop up randomly    HPI:  Joyce Rivas is a 25 y.o. female who  has a past medical history of Anxiety; Asthma; Chicken pox; Depression; Genital warts; Hepatitis; Heroin addiction (HCC); and Urinary tract infection. and presents today for an acute office visit.  This is a new problem. Recently diagnosed with poison ivy dermatitis and treated with an injection of methlyprednisilone and a prednisone dose pack. Reports taking the medication as prescribed and denies adverse side effects. She continues to have occasional welps located on her hands. Continues to experience a mild residual rash. Modifying factors include hydrocortisone cream which has helped a little.   No Known Allergies   Current Outpatient Prescriptions on File Prior to Visit  Medication Sig Dispense Refill  . acyclovir (ZOVIRAX) 400 MG tablet Take 1 tablet (400 mg total) by mouth 2 (two) times daily. 60 tablet 0  . albuterol (PROVENTIL HFA;VENTOLIN HFA) 108 (90 BASE) MCG/ACT inhaler Inhale 2 puffs into the lungs every 6 (six) hours as needed for wheezing or shortness of breath. 1 Inhaler 10  . Eluxadoline (VIBERZI) 100 MG TABS Take 100 mg by mouth 2 (two) times daily.     . propranolol (INDERAL) 10 MG tablet Take 10 mg by mouth 3 (three) times daily.    . QUEtiapine Fumarate (SEROQUEL XR) 150 MG 24 hr tablet Take 150 mg by mouth at bedtime.    . sertraline (ZOLOFT) 25 MG tablet Take 25 mg by mouth daily.    Marland Kitchen. topiramate (TOPAMAX) 200 MG tablet TAKE 1 TABLET BY MOUTH EVERY DAY 30 tablet 2   No current facility-administered medications on file prior to visit.     Review of Systems  Constitutional: Negative for chills and fever.  Respiratory: Negative for chest tightness and shortness of breath.   Skin:  Positive for rash.      Objective:    BP 122/82 (BP Location: Left Arm, Patient Position: Sitting, Cuff Size: Normal)   Pulse (!) 107   Temp 98 F (36.7 C) (Oral)   Resp 16   Ht 5\' 2"  (1.575 m)   Wt 132 lb 12.8 oz (60.2 kg)   SpO2 98%   BMI 24.29 kg/m  Nursing note and vital signs reviewed.  Physical Exam  Constitutional: She is oriented to person, place, and time. She appears well-developed and well-nourished. No distress.  Cardiovascular: Normal rate, regular rhythm, normal heart sounds and intact distal pulses.   Pulmonary/Chest: Effort normal and breath sounds normal.  Neurological: She is alert and oriented to person, place, and time.  Skin: Skin is warm and dry.  Small vesicles noted around wrist and hands and sporadically over bilateral upper extremities.   Psychiatric: She has a normal mood and affect. Her behavior is normal. Judgment and thought content normal.       Assessment & Plan:   Problem List Items Addressed This Visit      Musculoskeletal and Integument   Poison ivy    Rash consistent with possible rebound from previous poison ivy although cannot fully rule out possibility of scabies given the locations of lesions.  Start prednisone taper to treat for posion ivy and follow up if symptoms do not improve.  Relevant Medications   predniSONE (DELTASONE) 20 MG tablet    Other Visit Diagnoses   None.      I have discontinued Ms. Wolak's predniSONE. I am also having her start on predniSONE. Additionally, I am having her maintain her albuterol, acyclovir, topiramate, Eluxadoline, sertraline, QUEtiapine Fumarate, propranolol, and naltrexone.   Meds ordered this encounter  Medications  . naltrexone (DEPADE) 50 MG tablet    Sig: Take 50 mg by mouth daily.  . predniSONE (DELTASONE) 20 MG tablet    Sig: Take 3 tablets by mouth daily for 4 days, then 2 tablets by mouth daily for 4 days then 1 tablet by mouth daily for 4 days.    Dispense:  24 tablet     Refill:  0    Order Specific Question:   Supervising Provider    Answer:   Hillard DankerRAWFORD, ELIZABETH A [[id number]]     Follow-up: Return if symptoms worsen or fail to improve.  Jeanine Luzalone, Gregory, FNP

## 2015-11-09 ENCOUNTER — Encounter: Payer: Self-pay | Admitting: Family

## 2015-11-09 ENCOUNTER — Ambulatory Visit (INDEPENDENT_AMBULATORY_CARE_PROVIDER_SITE_OTHER): Payer: BLUE CROSS/BLUE SHIELD | Admitting: Family

## 2015-11-09 VITALS — BP 108/78 | HR 90 | Temp 98.2°F | Resp 16 | Ht 62.0 in | Wt 131.8 lb

## 2015-11-09 DIAGNOSIS — L709 Acne, unspecified: Secondary | ICD-10-CM

## 2015-11-09 DIAGNOSIS — F411 Generalized anxiety disorder: Secondary | ICD-10-CM

## 2015-11-09 DIAGNOSIS — Z23 Encounter for immunization: Secondary | ICD-10-CM

## 2015-11-09 MED ORDER — ERYTHROMYCIN 2 % EX GEL
Freq: Two times a day (BID) | CUTANEOUS | 1 refills | Status: DC
Start: 2015-11-09 — End: 2016-07-19

## 2015-11-09 MED ORDER — TRETINOIN 0.01 % EX GEL: Freq: Every day | CUTANEOUS | 0 refills | Status: DC

## 2015-11-09 NOTE — Assessment & Plan Note (Signed)
Continues to experience acne that is refractory to over-the-counter medications and treatments. Start tretinoin and erythromycin. Information about acne provided after visit summary. If symptoms worsen or do not improve consider referral to dermatology.

## 2015-11-09 NOTE — Progress Notes (Signed)
Subjective:    Patient ID: Joyce Rivas, female    DOB: 1990/05/12, 25 y.o.   MRN: [id number]  Chief Complaint  Patient presents with  . Follow-up    rx for acne?     HPI:  Joyce Rivas is a 25 y.o. female who  has a past medical history of Anxiety; Asthma; Chicken pox; Depression; Genital warts; Hepatitis; Heroin addiction (HCC); and Urinary tract infection. and presents today for an office follow up.  1.) Acne - Associated symptom of acne has been going on for several months that has been refractory to Clean and Clear and Proactiv. Described as uncomfortable. Does wear make-up on a regular basis and removed with make-up wipes.   2.) Anxiety - Currently maintained on propranolol and Zoloft. Reports taking the medication as prescribed and denies adverse side effects. Anxiety is generally well controlled but does experience occasional symptoms.  No Known Allergies    Outpatient Medications Prior to Visit  Medication Sig Dispense Refill  . acyclovir (ZOVIRAX) 400 MG tablet Take 1 tablet (400 mg total) by mouth 2 (two) times daily. 60 tablet 0  . albuterol (PROVENTIL HFA;VENTOLIN HFA) 108 (90 BASE) MCG/ACT inhaler Inhale 2 puffs into the lungs every 6 (six) hours as needed for wheezing or shortness of breath. 1 Inhaler 10  . Eluxadoline (VIBERZI) 100 MG TABS Take 100 mg by mouth 2 (two) times daily.     . naltrexone (DEPADE) 50 MG tablet Take 50 mg by mouth daily.    . propranolol (INDERAL) 10 MG tablet Take 10 mg by mouth 3 (three) times daily.    . QUEtiapine Fumarate (SEROQUEL XR) 150 MG 24 hr tablet Take 150 mg by mouth at bedtime.    . sertraline (ZOLOFT) 25 MG tablet Take 25 mg by mouth daily.    Marland Kitchen topiramate (TOPAMAX) 200 MG tablet TAKE 1 TABLET BY MOUTH EVERY DAY 30 tablet 2  . predniSONE (DELTASONE) 20 MG tablet Take 3 tablets by mouth daily for 4 days, then 2 tablets by mouth daily for 4 days then 1 tablet by mouth daily for 4 days. 24 tablet 0   No facility-administered  medications prior to visit.      Review of Systems  Constitutional: Negative for chills and fever.  Respiratory: Negative for chest tightness and shortness of breath.   Cardiovascular: Negative for chest pain.  Skin:       Positive for acne.   Psychiatric/Behavioral: Negative for dysphoric mood and suicidal ideas. The patient is not nervous/anxious.       Objective:    BP 108/78 (BP Location: Left Arm, Patient Position: Sitting, Cuff Size: Normal)   Pulse 90   Temp 98.2 F (36.8 C) (Oral)   Resp 16   Ht 5\' 2"  (1.575 m)   Wt 131 lb 12.8 oz (59.8 kg)   SpO2 98%   BMI 24.11 kg/m  Nursing note and vital signs reviewed.  Physical Exam  Constitutional: She is oriented to person, place, and time. She appears well-developed and well-nourished. No distress.  Cardiovascular: Normal rate, regular rhythm, normal heart sounds and intact distal pulses.   Pulmonary/Chest: Effort normal and breath sounds normal.  Neurological: She is alert and oriented to person, place, and time.  Skin: Skin is warm and dry.  Facial redness with closed comedones sporadic around the bilateral aspects of the face and neck. No discharge.  Psychiatric: She has a normal mood and affect. Her behavior is normal. Judgment and thought content normal.  Assessment & Plan:   Problem List Items Addressed This Visit      Musculoskeletal and Integument   Acne - Primary    Continues to experience acne that is refractory to over-the-counter medications and treatments. Start tretinoin and erythromycin. Information about acne provided after visit summary. If symptoms worsen or do not improve consider referral to dermatology.      Relevant Medications   tretinoin (RETIN-A) 0.01 % gel   erythromycin with ethanol (EMGEL) 2 % gel     Other   Generalized anxiety disorder    Generalized anxiety disorder appears adequately controlled with current regimen and no adverse side effects. Does continue to experience  occasional exacerbation of symptoms. Also notes mild increase in fatigue. Continue current dosage of propranolol and sertraline. Continue to monitor.       Other Visit Diagnoses    Encounter for immunization       Relevant Medications   tretinoin (RETIN-A) 0.01 % gel   erythromycin with ethanol (EMGEL) 2 % gel   Other Relevant Orders   Flu Vaccine QUAD 36+ mos IM (Completed)   Tdap vaccine greater than or equal to 7yo IM (Completed)   Need for Tdap vaccination       Relevant Orders   Tdap vaccine greater than or equal to 7yo IM (Completed)   Encounter for immunization       Relevant Orders   Flu Vaccine QUAD 36+ mos IM (Completed)       I have discontinued Ms. Soderquist's predniSONE. I am also having her start on tretinoin and erythromycin with ethanol. Additionally, I am having her maintain her albuterol, acyclovir, topiramate, Eluxadoline, sertraline, QUEtiapine Fumarate, propranolol, and naltrexone.   Meds ordered this encounter  Medications  . tretinoin (RETIN-A) 0.01 % gel    Sig: Apply topically at bedtime.    Dispense:  45 g    Refill:  0    Order Specific Question:   Supervising Provider    Answer:   Hillard DankerRAWFORD, ELIZABETH A [[id number]]  . erythromycin with ethanol (EMGEL) 2 % gel    Sig: Apply topically 2 (two) times daily.    Dispense:  30 g    Refill:  1    Order Specific Question:   Supervising Provider    Answer:   Hillard DankerRAWFORD, ELIZABETH A [[id number]]     Follow-up: Return in about 6 months (around 05/09/2016), or if symptoms worsen or fail to improve.  Jeanine Luzalone, Gregory, FNP

## 2015-11-09 NOTE — Patient Instructions (Signed)
Thank you for choosing [facility].  SUMMARY AND INSTRUCTIONS:  Medication:  Your prescription(s) have been submitted to your pharmacy or been printed and provided for you. Please take as directed and contact our office if you believe you are having problem(s) with the medication(s) or have any questions.    Acne Acne is a skin problem that causes pimples. Acne occurs when the pores in the skin get blocked. The pores may become infected with bacteria, or they may become red, sore, and swollen. Acne is a common skin problem, especially for teenagers. Acne usually goes away over time. CAUSES Each pore contains an oil gland. Oil glands make an oily substance that is called sebum. Acne happens when these glands get plugged with sebum, dead skin cells, and dirt. Then, the bacteria that are normally found in the oil glands multiply and cause inflammation. Acne is commonly triggered by changes in your hormones. These hormonal changes can cause the oil glands to get bigger and to make more sebum. Factors that can make acne worse include:  Hormone changes during:  Adolescence.  Women's menstrual cycles.  Pregnancy.  Oil-based cosmetics and hair products.  Harshly scrubbing the skin.  Strong soaps.  Stress.  Hormone problems that are due to certain diseases.  Long or oily hair rubbing against the skin.  Certain medicines.  Pressure from headbands, backpacks, or shoulder pads.  Exposure to certain oils and chemicals. RISK FACTORS This condition is more likely to develop in:  Teenagers.  People who have a family history of acne. SYMPTOMS Acne often occurs on the face, neck, chest, and upper back. Symptoms include:  Small, red bumps (pimples or papules).  Whiteheads.  Blackheads.  Small, pus-filled pimples (pustules).  Big, red pimples or pustules that feel tender. More severe acne can cause:  An infected area that contains a collection of pus  (abscess).  Hard, painful, fluid-filled sacs (cysts).  Scars. DIAGNOSIS This condition is diagnosed with a medical history and physical exam. Blood tests may also be done. TREATMENT Treatment for this condition can vary depending on the severity of your acne. Treatment may include:  Creams and lotions that prevent oil glands from clogging.  Creams and lotions that treat or prevent infections and inflammation.  Antibiotic medicines that are applied to the skin or taken as a pill.  Pills that decrease sebum production.  Birth control pills.  Light or laser treatments.  Surgery.  Injections of medicine into the affected areas.  Chemicals that cause peeling of the skin. Your health care provider will also recommend the best way to take care of your skin. Good skin care is the most important part of treatment. HOME CARE INSTRUCTIONS Skin Care Take care of your skin as told by your health care provider. You may be told to do these things:  Wash your skin gently at least two times each day, as well as:  After you exercise.  Before you go to bed.  Use mild soap.  Apply a water-based skin moisturizer after you wash your skin.  Use a sunscreen or sunblock with SPF 30 or greater. This is especially important if you are using acne medicines.  Choose cosmetics that will not plug your oil glands (are noncomedogenic). Medicines  Take over-the-counter and prescription medicines only as told by your health care provider.  If you were prescribed an antibiotic medicine, apply or take it as told by your health care provider. Do not stop taking the antibiotic even if your condition improves.  General Instructions  Keep your hair clean and off of your face. If you have oily hair, shampoo your hair regularly or daily.  Avoid leaning your chin or forehead against your hands.  Avoid wearing tight headbands or hats.  Avoid picking or squeezing your pimples. That can make your acne  worse and cause scarring.  Keep all follow-up visits as told by your health care provider. This is important.  Shave gently and only when necessary.  Keep a food journal to figure out if any foods are linked with your acne. SEEK MEDICAL CARE IF:  Your acne is not better after eight weeks.  Your acne gets worse.  You have a large area of skin that is red or tender.  You think that you are having side effects from any acne medicine.   This information is not intended to replace advice given to you by your health care provider. Make sure you discuss any questions you have with your health care provider.   Document Released: 01/14/2000 Document Revised: 10/07/2014 Document Reviewed: 03/25/2014 Elsevier Interactive Patient Education [employer]

## 2015-11-09 NOTE — Assessment & Plan Note (Signed)
Generalized anxiety disorder appears adequately controlled with current regimen and no adverse side effects. Does continue to experience occasional exacerbation of symptoms. Also notes mild increase in fatigue. Continue current dosage of propranolol and sertraline. Continue to monitor.

## 2016-03-29 ENCOUNTER — Encounter: Payer: Self-pay | Admitting: Family

## 2016-03-29 ENCOUNTER — Ambulatory Visit (INDEPENDENT_AMBULATORY_CARE_PROVIDER_SITE_OTHER): Payer: Self-pay | Admitting: Family

## 2016-03-29 ENCOUNTER — Encounter: Payer: Self-pay | Admitting: *Deleted

## 2016-03-29 VITALS — BP 110/80 | HR 64 | Temp 98.0°F | Wt 124.6 lb

## 2016-03-29 DIAGNOSIS — J029 Acute pharyngitis, unspecified: Secondary | ICD-10-CM

## 2016-03-29 LAB — POCT RAPID STREP A (OFFICE): Rapid Strep A Screen: NEGATIVE

## 2016-03-29 MED ORDER — GUAIFENESIN ER 600 MG PO TB12: 600.0000 mg | ORAL_TABLET | Freq: Two times a day (BID) | ORAL | 0 refills | Status: DC

## 2016-03-29 MED ORDER — BENZONATATE 100 MG PO CAPS: 100.0000 mg | ORAL_CAPSULE | Freq: Three times a day (TID) | ORAL | 0 refills | Status: DC | PRN

## 2016-03-29 MED ORDER — PREDNISONE 5 MG PO TABS: 5.0000 mg | ORAL_TABLET | ORAL | 0 refills | Status: DC

## 2016-03-29 MED ORDER — ALBUTEROL SULFATE HFA 108 (90 BASE) MCG/ACT IN AERS: 2.0000 | INHALATION_SPRAY | Freq: Four times a day (QID) | RESPIRATORY_TRACT | 1 refills | Status: DC | PRN

## 2016-03-29 NOTE — Progress Notes (Signed)
Gustavo LahErin Schlichting  Chief Complaint  Patient presents with  . Sore Throat    No diagnosis found.  Patient Active Problem List   Diagnosis Date Noted  . Poison ivy 09/15/2015  . Canker sore 11/10/2014  . Cold sore 04/29/2014  . Generalized anxiety disorder 04/01/2014  . 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 Acne 04/01/2014    Past Medical History:  Diagnosis Date  . Anxiety   . Asthma   . Chicken pox   . Depression   . Genital warts   . Hepatitis   . Heroin addiction (HCC)   . Urinary tract infection     Past Surgical History:  Procedure Laterality Date  . wisdom teeth removal      No Known Allergies  BP 110/80   Pulse 64   Temp 98 F (36.7 C) (Oral)   Wt 124 lb 9.6 oz (56.5 kg)   LMP 03/23/2016   BMI 22.79 kg/m   Review of Systems  Constitutional: Positive for malaise/fatigue. Negative for chills, diaphoresis, fever and weight loss.  HENT: Positive for ear pain (left ear).   Eyes: Negative.   Respiratory: Positive for cough, sputum production, shortness of breath and wheezing.   Cardiovascular: Negative.   Gastrointestinal: Negative.   Genitourinary: Negative.   Musculoskeletal: Negative.   Skin: Negative.   Neurological: Negative.  Negative for weakness.  Endo/Heme/Allergies: Negative.   Psychiatric/Behavioral: Negative.   All other systems reviewed and are negative.  Physical Exam  Constitutional: She is oriented to person, place, and time. She appears well-developed and well-nourished.  HENT:  Head: Normocephalic and atraumatic.  Right Ear: External ear normal.  Left Ear: External ear normal.  Mild serous tympanic bulge (left), no sign of infection  Eyes: Conjunctivae and EOM are normal. Pupils are equal, round, and reactive to light.  Neck: Normal range of motion. Neck supple.  Cardiovascular: Normal rate, regular rhythm and normal heart sounds.   Pulmonary/Chest: Effort normal and breath  sounds normal.  Abdominal: Soft. Bowel sounds are normal.  Musculoskeletal: Normal range of motion.  Neurological: She is alert and oriented to person, place, and time.  Skin: Skin is warm and dry. Capillary refill takes less than 2 seconds.    No results found for this or any previous visit (from the past 72 hour(s)).   Current Outpatient Prescriptions:  .  acyclovir (ZOVIRAX) 400 MG tablet, Take 1 tablet (400 mg total) by mouth 2 (two) times daily., Disp: 60 tablet, Rfl: 0 .  albuterol (PROVENTIL HFA;VENTOLIN HFA) 108 (90 BASE) MCG/ACT inhaler, Inhale 2 puffs into the lungs every 6 (six) hours as needed for wheezing or shortness of breath., Disp: 1 Inhaler, Rfl: 10 .  Eluxadoline (VIBERZI) 100 MG TABS, Take 100 mg by mouth 2 (two) times daily. , Disp: , Rfl:  .  naltrexone (DEPADE) 50 MG tablet, Take 50 mg by mouth daily., Disp: , Rfl:  .  propranolol (INDERAL) 10 MG tablet, Take 10 mg by mouth 3 (three) times daily., Disp: , Rfl:  .  sertraline (ZOLOFT) 25 MG tablet, Take 25 mg by mouth daily., Disp: , Rfl:  .  topiramate (TOPAMAX) 200 MG tablet, TAKE 1 TABLET BY MOUTH EVERY DAY, Disp: 30 tablet, Rfl: 2 .  erythromycin with ethanol (EMGEL) 2 % gel, Apply topically 2 (two) times daily. (Patient not taking: Reported on 03/29/2016), Disp: 30 g, Rfl: 1 .  QUEtiapine Fumarate (SEROQUEL XR) 150 MG 24 hr tablet,  Take 150 mg by mouth at bedtime., Disp: , Rfl:  .  tretinoin (RETIN-A) 0.01 % gel, Apply topically at bedtime. (Patient not taking: Reported on 03/29/2016), Disp: 45 g, Rfl: 0  Subjective: "I have been sick for the past 48h. No fever except 99. I tried to cough some stuff up but it won't come up. My kid was sick recently also. My left ear has some pressure in it also."  Objective: Pt is a 26yo female presenting today for evaluation of pharyngitis, well-nourished, and in NAD. Strep test negative, no flu test in stock. Exposed to others with URI symptoms.  Assessment: Viral  pharyngitis  Plan:  -Albuterol inhaler refill (hfa 108, 2 puffs q6h prn) -Tessalon Perles 100mg , take 1-2 capsules q8h prn cough -Prednisone 5mg  21 dose pack taper  -Mucinex 600mg  po bid x 5 days  Beau Fanny, [place] 03/29/2016 4:29 PM

## 2016-03-29 NOTE — Addendum Note (Signed)
Addended by: Kern ReapVEREEN, Zachari Alberta B on: 03/29/2016 04:39 PM   Modules accepted: Orders

## 2016-05-09 ENCOUNTER — Encounter: Payer: BLUE CROSS/BLUE SHIELD | Admitting: Family

## 2016-05-11 ENCOUNTER — Encounter: Payer: Self-pay | Admitting: Family

## 2016-05-11 ENCOUNTER — Ambulatory Visit (INDEPENDENT_AMBULATORY_CARE_PROVIDER_SITE_OTHER): Payer: BLUE CROSS/BLUE SHIELD | Admitting: Family

## 2016-05-11 ENCOUNTER — Other Ambulatory Visit (INDEPENDENT_AMBULATORY_CARE_PROVIDER_SITE_OTHER): Payer: BLUE CROSS/BLUE SHIELD

## 2016-05-11 DIAGNOSIS — Z0000 Encounter for general adult medical examination without abnormal findings: Secondary | ICD-10-CM | POA: Diagnosis not present

## 2016-05-11 LAB — LIPID PANEL
CHOL/HDL RATIO: 3
Cholesterol: 138 mg/dL (ref 0–200)
HDL: 47.9 mg/dL (ref >39.00)
LDL CALC: 82 mg/dL (ref 0–99)
NONHDL: 89.92
Triglycerides: 40 mg/dL (ref 0.0–149.0)
VLDL: 8 mg/dL (ref 0.0–40.0)

## 2016-05-11 NOTE — Assessment & Plan Note (Signed)
1) Anticipatory Guidance: Discussed importance of wearing a seatbelt while driving and not texting while driving; changing batteries in smoke detector at least once annually; wearing suntan lotion when outside; eating a balanced and moderate diet; getting physical activity at least 30 minutes per day.  2) Immunizations / Screenings / Labs:  All immunizations are up-to-date per recommendations. Due for a dental and vision exam encouraged to be completed independently. Cervical cancer screening is up-to-date. Obtain lipid profile.  CBC and complete metabolic profile completed through gynecology.  Overall well exam with risk factors for cardiovascular disease being minimal at this time. Would recommend increasing number of meals per day to 2-3 she continues to experience some decreased appetite. Chronic conditions appear adequately controlled through medication regimens. Her goal is to go back to school to become a substance abuse counselor. Emphasize importance of remaining clean/sober. Continue current healthy lifestyle behaviors and choices. Follow-up prevention exam in 1 year. Follow-up office visit pending blood work.

## 2016-05-11 NOTE — Patient Instructions (Addendum)
Thank you for choosing [facility].  SUMMARY AND INSTRUCTIONS:  Continue to take your medication as prescribed.  Hip and back strengthening exercises.   Labs:  Please stop by the lab on the lower level of the building for your blood work. Your results will be released to [place] (or called to you) after review, usually within 72 hours after test completion. If any changes need to be made, you will be notified at that same time.  1.) The lab is open from 7:30am to 5:30 pm Monday-Friday 2.) No appointment is necessary 3.) Fasting (if needed) is 6-8 hours after food and drink; black coffee and water are okay    Follow up:  If your symptoms worsen or fail to improve, please contact our office for further instruction, or in case of emergency go directly to the emergency room at the closest medical facility.    Health Maintenance, Female Adopting a healthy lifestyle and getting preventive care can go a long way to promote health and wellness. Talk with your health care provider about what schedule of regular examinations is right for you. This is a good chance for you to check in with your provider about disease prevention and staying healthy. In between checkups, there are plenty of things you can do on your own. Experts have done a lot of research about which lifestyle changes and preventive measures are most likely to keep you healthy. Ask your health care provider for more information. Weight and diet Eat a healthy diet  Be sure to include plenty of vegetables, fruits, low-fat dairy products, and lean protein.  Do not eat a lot of foods high in solid fats, added sugars, or salt.  Get regular exercise. This is one of the most important things you can do for your health.  Most adults should exercise for at least 150 minutes each week. The exercise should increase your heart rate and make you sweat (moderate-intensity exercise).  Most adults should also do strengthening  exercises at least twice a week. This is in addition to the moderate-intensity exercise. Maintain a healthy weight  Body mass index (BMI) is a measurement that can be used to identify possible weight problems. It estimates body fat based on height and weight. Your health care provider can help determine your BMI and help you achieve or maintain a healthy weight.  For females 58 years of age and older:  A BMI below 18.5 is considered underweight.  A BMI of 18.5 to 24.9 is normal.  A BMI of 25 to 29.9 is considered overweight.  A BMI of 30 and above is considered obese. Watch levels of cholesterol and blood lipids  You should start having your blood tested for lipids and cholesterol at 26 years of age, then have this test every 5 years.  You may need to have your cholesterol levels checked more often if:  Your lipid or cholesterol levels are high.  You are older than 26 years of age.  You are at high risk for heart disease. Cancer screening Lung Cancer  Lung cancer screening is recommended for adults 43-32 years old who are at high risk for lung cancer because of a history of smoking.  A yearly low-dose CT scan of the lungs is recommended for people who:  Currently smoke.  Have quit within the past 15 years.  Have at least a 30-pack-year history of smoking. A pack year is smoking an average of one pack of cigarettes a day for 1 year.  Yearly screening should continue until it has been 15 years since you quit.  Yearly screening should stop if you develop a health problem that would prevent you from having lung cancer treatment. Breast Cancer  Practice breast self-awareness. This means understanding how your breasts normally appear and feel.  It also means doing regular breast self-exams. Let your health care provider know about any changes, no matter how small.  If you are in your 20s or 30s, you should have a clinical breast exam (CBE) by a health care provider every 1-3  years as part of a regular health exam.  If you are 84 or older, have a CBE every year. Also consider having a breast X-ray (mammogram) every year.  If you have a family history of breast cancer, talk to your health care provider about genetic screening.  If you are at high risk for breast cancer, talk to your health care provider about having an MRI and a mammogram every year.  Breast cancer gene (BRCA) assessment is recommended for women who have family members with BRCA-related cancers. BRCA-related cancers include:  Breast.  Ovarian.  Tubal.  Peritoneal cancers.  Results of the assessment will determine the need for genetic counseling and BRCA1 and BRCA2 testing. Cervical Cancer  Your health care provider may recommend that you be screened regularly for cancer of the pelvic organs (ovaries, uterus, and vagina). This screening involves a pelvic examination, including checking for microscopic changes to the surface of your cervix (Pap test). You may be encouraged to have this screening done every 3 years, beginning at age 59.  For women ages 45-65, health care providers may recommend pelvic exams and Pap testing every 3 years, or they may recommend the Pap and pelvic exam, combined with testing for human papilloma virus (HPV), every 5 years. Some types of HPV increase your risk of cervical cancer. Testing for HPV may also be done on women of any age with unclear Pap test results.  Other health care providers may not recommend any screening for nonpregnant women who are considered low risk for pelvic cancer and who do not have symptoms. Ask your health care provider if a screening pelvic exam is right for you.  If you have had past treatment for cervical cancer or a condition that could lead to cancer, you need Pap tests and screening for cancer for at least 20 years after your treatment. If Pap tests have been discontinued, your risk factors (such as having a new sexual partner) need to  be reassessed to determine if screening should resume. Some women have medical problems that increase the chance of getting cervical cancer. In these cases, your health care provider may recommend more frequent screening and Pap tests. Colorectal Cancer  This type of cancer can be detected and often prevented.  Routine colorectal cancer screening usually begins at 26 years of age and continues through 26 years of age.  Your health care provider may recommend screening at an earlier age if you have risk factors for colon cancer.  Your health care provider may also recommend using home test kits to check for hidden blood in the stool.  A small camera at the end of a tube can be used to examine your colon directly (sigmoidoscopy or colonoscopy). This is done to check for the earliest forms of colorectal cancer.  Routine screening usually begins at age 55.  Direct examination of the colon should be repeated every 5-10 years through 26 years of age. However, you  may need to be screened more often if early forms of precancerous polyps or small growths are found. Skin Cancer  Check your skin from head to toe regularly.  Tell your health care provider about any new moles or changes in moles, especially if there is a change in a mole's shape or color.  Also tell your health care provider if you have a mole that is larger than the size of a pencil eraser.  Always use sunscreen. Apply sunscreen liberally and repeatedly throughout the day.  Protect yourself by wearing long sleeves, pants, a wide-brimmed hat, and sunglasses whenever you are outside. Heart disease, diabetes, and high blood pressure  High blood pressure causes heart disease and increases the risk of stroke. High blood pressure is more likely to develop in:  People who have blood pressure in the high end of the normal range (130-139/85-89 mm Hg).  People who are overweight or obese.  People who are African American.  If you are  68-82 years of age, have your blood pressure checked every 3-5 years. If you are 53 years of age or older, have your blood pressure checked every year. You should have your blood pressure measured twice-once when you are at a hospital or clinic, and once when you are not at a hospital or clinic. Record the average of the two measurements. To check your blood pressure when you are not at a hospital or clinic, you can use:  An automated blood pressure machine at a pharmacy.  A home blood pressure monitor.  If you are between 77 years and 72 years old, ask your health care provider if you should take aspirin to prevent strokes.  Have regular diabetes screenings. This involves taking a blood sample to check your fasting blood sugar level.  If you are at a normal weight and have a low risk for diabetes, have this test once every three years after 26 years of age.  If you are overweight and have a high risk for diabetes, consider being tested at a younger age or more often. Preventing infection Hepatitis B  If you have a higher risk for hepatitis B, you should be screened for this virus. You are considered at high risk for hepatitis B if:  You were born in a country where hepatitis B is common. Ask your health care provider which countries are considered high risk.  Your parents were born in a high-risk country, and you have not been immunized against hepatitis B (hepatitis B vaccine).  You have HIV or AIDS.  You use needles to inject street drugs.  You live with someone who has hepatitis B.  You have had sex with someone who has hepatitis B.  You get hemodialysis treatment.  You take certain medicines for conditions, including cancer, organ transplantation, and autoimmune conditions. Hepatitis C  Blood testing is recommended for:  Everyone born from [birth year] through 1965.  Anyone with known risk factors for hepatitis C. Sexually transmitted infections (STIs)  You should be screened  for sexually transmitted infections (STIs) including gonorrhea and chlamydia if:  You are sexually active and are younger than 26 years of age.  You are older than 26 years of age and your health care provider tells you that you are at risk for this type of infection.  Your sexual activity has changed since you were last screened and you are at an increased risk for chlamydia or gonorrhea. Ask your health care provider if you are at risk.  If  you do not have HIV, but are at risk, it may be recommended that you take a prescription medicine daily to prevent HIV infection. This is called pre-exposure prophylaxis (PrEP). You are considered at risk if:  You are sexually active and do not regularly use condoms or know the HIV status of your partner(s).  You take drugs by injection.  You are sexually active with a partner who has HIV. Talk with your health care provider about whether you are at high risk of being infected with HIV. If you choose to begin PrEP, you should first be tested for HIV. You should then be tested every 3 months for as long as you are taking PrEP. Pregnancy  If you are premenopausal and you may become pregnant, ask your health care provider about preconception counseling.  If you may become pregnant, take 400 to 800 micrograms (mcg) of folic acid every day.  If you want to prevent pregnancy, talk to your health care provider about birth control (contraception). Osteoporosis and menopause  Osteoporosis is a disease in which the bones lose minerals and strength with aging. This can result in serious bone fractures. Your risk for osteoporosis can be identified using a bone density scan.  If you are 47 years of age or older, or if you are at risk for osteoporosis and fractures, ask your health care provider if you should be screened.  Ask your health care provider whether you should take a calcium or vitamin D supplement to lower your risk for osteoporosis.  Menopause may  have certain physical symptoms and risks.  Hormone replacement therapy may reduce some of these symptoms and risks. Talk to your health care provider about whether hormone replacement therapy is right for you. Follow these instructions at home:  Schedule regular health, dental, and eye exams.  Stay current with your immunizations.  Do not use any tobacco products including cigarettes, chewing tobacco, or electronic cigarettes.  If you are pregnant, do not drink alcohol.  If you are breastfeeding, limit how much and how often you drink alcohol.  Limit alcohol intake to no more than 1 drink per day for nonpregnant women. One drink equals 12 ounces of beer, 5 ounces of wine, or 1 ounces of hard liquor.  Do not use street drugs.  Do not share needles.  Ask your health care provider for help if you need support or information about quitting drugs.  Tell your health care provider if you often feel depressed.  Tell your health care provider if you have ever been abused or do not feel safe at home. This information is not intended to replace advice given to you by your health care provider. Make sure you discuss any questions you have with your health care provider. Document Released: 08/01/2010 Document Revised: 06/24/2015 Document Reviewed: 10/20/2014 Elsevier Interactive Patient Education  2017 [place].   Hip Exercises Ask your health care provider which exercises are safe for you. Do exercises exactly as told by your health care provider and adjust them as directed. It is normal to feel mild stretching, pulling, tightness, or discomfort as you do these exercises, but you should stop right away if you feel sudden pain or your pain gets worse.Do not begin these exercises until told by your health care provider. STRETCHING AND RANGE OF MOTION EXERCISES  These exercises warm up your muscles and joints and improve the movement and flexibility of your hip. These exercises also help  to relieve pain, numbness, and tingling. Exercise A:  Hamstrings, Supine   1. Lie on your back. 2. Loop a belt or towel over the ball of your left / rightfoot. The ball of your foot is on the walking surface, right under your toes. 3. Straighten your left / rightknee and slowly pull on the belt to raise your leg.  Do not let your left / right knee bend while you do this.  Keep your other leg flat on the floor.  Raise the left / right leg until you feel a gentle stretch behind your left / right knee or thigh. 4. Hold this position for __________ seconds. 5. Slowly return your leg to the starting position. Repeat __________ times. Complete this stretch __________ times a day. Exercise B: Hip Rotators   1. Lie on your back on a firm surface. 2. Hold your left / right knee with your left / right hand. Hold your ankle with your other hand. 3. Gently pull your left / right knee and rotate your lower leg toward your other shoulder.  Pull until you feel a stretch in your buttocks.  Keep your hips and shoulders firmly planted while you do this stretch. 4. Hold this position for __________ seconds. Repeat __________ times. Complete this stretch __________ times a day. Exercise C: V-Sit (Hamstrings and Adductors)   1. Sit on the floor with your legs extended in a large "V" shape. Keep your knees straight during this exercise. 2. Start with your head and chest upright, then bend at your waist to reach for your left foot (position A). You should feel a stretch in your right inner thigh. 3. Hold this position for __________ seconds. Then slowly return to the upright position. 4. Bend at your waist to reach forward (position B). You should feel a stretch behind both of your thighs and knees. 5. Hold this position for __________ seconds. Then slowly return to the upright position. 6. Bend at your waist to reach for your right foot (position C). You should feel a stretch in your left inner  thigh. 7. Hold this position for __________ seconds. Then slowly return to the upright position. Repeat __________ times. Complete this stretch __________ times a day. Exercise D: Lunge (Hip Flexors)   1. Place your left / right knee on the floor and bend your other knee so that is directly over your ankle. You should be half-kneeling. 2. Keep good posture with your head over your shoulders. 3. Tighten your buttocks to point your tailbone downward. This helps your back to keep from arching too much. 4. You should feel a gentle stretch in the front of your left / right thigh and hip. If you do not feel any resistance, slightly slide your other foot forward and then slowly lunge forward so your knee once again lines up over your ankle. 5. Make sure your tailbone continues to point downward. 6. Hold this position for __________ seconds. Repeat __________ times. Complete this stretch __________ times a day. STRENGTHENING EXERCISES  These exercises build strength and endurance in your hip. Endurance is the ability to use your muscles for a long time, even after they get tired. Exercise E: Bridge (Hip Extensors)   1. Lie on your back on a firm surface with your knees bent and your feet flat on the floor. 2. Tighten your buttocks muscles and lift your bottom off the floor until the trunk of your body is level with your thighs.  Do not arch your back.  You should feel the muscles working in  your buttocks and the back of your thighs. If you do not feel these muscles, slide your feet 1-2 inches (2.5-5 cm) farther away from your buttocks. 3. Hold this position for __________ seconds. 4. Slowly lower your hips to the starting position. 5. Let your muscles relax completely between repetitions. 6. If this exercise is too easy, try doing it with your arms crossed over your chest. Repeat __________ times. Complete this exercise __________ times a day. Exercise F: Straight Leg Raises - Hip Abductors    1. Lie on your side with your left / right leg in the top position. Lie so your head, shoulder, knee, and hip line up with each other. You may bend your bottom knee to help you balance. 2. Roll your hips slightly forward, so your hips are stacked directly over each other and your left / right knee is facing forward. 3. Leading with your heel, lift your top leg 4-6 inches (10-15 cm). You should feel the muscles in your outer hip lifting.  Do not let your foot drift forward.  Do not let your knee roll toward the ceiling. 4. Hold this position for __________ seconds. 5. Slowly return to the starting position. 6. Let your muscles relax completely between repetitions. Repeat __________ times. Complete this exercise __________ times a day. Exercise G: Straight Leg Raises - Hip Adductors   1. Lie on your side with your left / right leg in the bottom position. Lie so your head, shoulder, knee, and hip line up. You may place your upper foot in front to help you balance. 2. Roll your hips slightly forward, so your hips are stacked directly over each other and your left / right knee is facing forward. 3. Tense the muscles in your inner thigh and lift your bottom leg 4-6 inches (10-15 cm). 4. Hold this position for __________ seconds. 5. Slowly return to the starting position. 6. Let your muscles relax completely between repetitions. Repeat __________ times. Complete this exercise __________ times a day. Exercise H: Straight Leg Raises - Quadriceps   1. Lie on your back with your left / right leg extended and your other knee bent. 2. Tense the muscles in the front of your left / right thigh. When you do this, you should see your kneecap slide up or see increased dimpling just above your knee. 3. Tighten these muscles even more and raise your leg 4-6 inches (10-15 cm) off the floor. 4. Hold this position for __________ seconds. 5. Keep these muscles tense as you lower your leg. 6. Relax the muscles  slowly and completely between repetitions. Repeat __________ times. Complete this exercise __________ times a day. Exercise I: Hip Abductors, Standing  1. Tie one end of a rubber exercise band or tubing to a secure surface, such as a table or pole. 2. Loop the other end of the band or tubing around your left / right ankle. 3. Keeping your ankle with the band or tubing directly opposite of the secured end, step away until there is tension in the tubing or band. Hold onto a chair as needed for balance. 4. Lift your left / right leg out to your side. While you do this:  Keep your back upright.  Keep your shoulders over your hips.  Keep your toes pointing forward.  Make sure to use your hip muscles to lift your leg. Do not "throw" your leg or tip your body to lift your leg. 5. Hold this position for __________ seconds. 6. Slowly  return to the starting position. Repeat __________ times. Complete this exercise __________ times a day. Exercise J: Squats (Quadriceps)  1. Stand in a door frame so your feet and knees are in line with the frame. You may place your hands on the frame for balance. 2. Slowly bend your knees and lower your hips like you are going to sit in a chair.  Keep your lower legs in a straight-up-and-down position.  Do not let your hips go lower than your knees.  Do not bend your knees lower than told by your health care provider.  If your hip pain increases, do not bend as low. 3. Hold this position for ___________ seconds. 4. Slowly push with your legs to return to standing. Do not use your hands to pull yourself to standing. Repeat __________ times. Complete this exercise __________ times a day. This information is not intended to replace advice given to you by your health care provider. Make sure you discuss any questions you have with your health care provider. Document Released: 02/03/2005 Document Revised: 10/11/2015 Document Reviewed: 01/11/2015 Elsevier Interactive  Patient Education  2017 [employer].

## 2016-05-11 NOTE — Progress Notes (Signed)
Subjective:    Patient ID: Joyce Rivas, female    DOB: November 29, 1990, 26 y.o.   MRN: [id number]  Chief Complaint  Patient presents with  . CPE    fasting, wants to get on celexa instead of zoloft    HPI:  Joyce Rivas is a 26 y.o. female who presents today for an annual wellness visit.   1) Health Maintenance -   Diet - Averages about 1-2 meals per day consisting of a regular diet; Caffeine intake of 2-3 cups per day.  Exercise - At work; no structured exercise.    2) Preventative Exams / Immunizations:  Dental -- Due for exam  Vision -- Due for exam.    Health Maintenance  Topic Date Due  . HIV Screening  07/10/2005  . INFLUENZA VACCINE  08/30/2016  . PAP SMEAR  05/12/2019  . TETANUS/TDAP  11/08/2025    Immunization History  Administered Date(s) Administered  . Influenza,inj,Quad PF,36+ Mos 11/09/2015  . Tdap 11/09/2015     No Known Allergies   Outpatient Medications Prior to Visit  Medication Sig Dispense Refill  . acyclovir (ZOVIRAX) 400 MG tablet Take 1 tablet (400 mg total) by mouth 2 (two) times daily. 60 tablet 0  . albuterol (PROVENTIL HFA;VENTOLIN HFA) 108 (90 Base) MCG/ACT inhaler Inhale 2 puffs into the lungs every 6 (six) hours as needed for wheezing or shortness of breath. 1 Inhaler 1  . Eluxadoline (VIBERZI) 100 MG TABS Take 100 mg by mouth 2 (two) times daily.     Marland Kitchen erythromycin with ethanol (EMGEL) 2 % gel Apply topically 2 (two) times daily. 30 g 1  . naltrexone (DEPADE) 50 MG tablet Take 50 mg by mouth daily.    . propranolol (INDERAL) 10 MG tablet Take 10 mg by mouth 3 (three) times daily.    . QUEtiapine Fumarate (SEROQUEL XR) 150 MG 24 hr tablet Take 150 mg by mouth at bedtime.    . topiramate (TOPAMAX) 200 MG tablet TAKE 1 TABLET BY MOUTH EVERY DAY 30 tablet 2  . sertraline (ZOLOFT) 25 MG tablet Take 25 mg by mouth daily.    . benzonatate (TESSALON) 100 MG capsule Take 1-2 capsules (100-200 mg total) by mouth every 8 (eight) hours as needed  for cough. 30 capsule 0  . guaiFENesin (MUCINEX) 600 MG 12 hr tablet Take 1 tablet (600 mg total) by mouth 2 (two) times daily. 10 tablet 0  . predniSONE (DELTASONE) 5 MG tablet Take 1 tablet (5 mg total) by mouth as directed. 21 dose pack taper per pharmacy 21 tablet 0  . tretinoin (RETIN-A) 0.01 % gel Apply topically at bedtime. (Patient not taking: Reported on 03/29/2016) 45 g 0   No facility-administered medications prior to visit.      Past Medical History:  Diagnosis Date  . Anxiety   . Asthma   . Chicken pox   . Depression   . Genital warts   . Hepatitis   . Heroin addiction (HCC)   . Urinary tract infection      Past Surgical History:  Procedure Laterality Date  . wisdom teeth removal       Family History  Problem Relation Age of Onset  . Drug abuse Mother   . Kidney cancer Mother   . Bladder Cancer Mother   . Drug abuse Father   . Stroke Father   . Hypertension Father   . Arthritis Maternal Grandmother   . Diabetes Paternal Grandmother      Social  History   Social History  . Marital status: Single    Spouse name: N/A  . Number of children: 0  . Years of education: 54   Occupational History  . Cleaner     Social History Main Topics  . Smoking status: Current Every Day Smoker    Packs/day: 1.00    Years: 10.00    Types: E-cigarettes    Last attempt to quit: 02/16/2014  . Smokeless tobacco: Never Used  . Alcohol use No  . Drug use: No  . Sexual activity: Not on file   Other Topics Concern  . Not on file   Social History Narrative   Born in [place], [place] and raised in [place], [place]. Lives with her husband. No pets. Fun: Dance and sing.   Denies any religious beliefs effecting healthcare.       Review of Systems  Constitutional: Denies fever, chills, fatigue, or significant weight gain/loss. HENT: Head: Denies headache or neck pain Ears: Denies changes in hearing, ringing in ears, earache, drainage Nose: Denies discharge, stuffiness,  itching, nosebleed, sinus pain Throat: Denies sore throat, hoarseness, dry mouth, sores, thrush Eyes: Denies loss/changes in vision, pain, redness, blurry/double vision, flashing lights Cardiovascular: Denies chest pain/discomfort, tightness, palpitations, shortness of breath with activity, difficulty lying down, swelling, sudden awakening with shortness of breath Respiratory: Denies shortness of breath, cough, sputum production, wheezing Gastrointestinal: Denies dysphasia, heartburn, change in appetite, nausea, change in bowel habits, rectal bleeding, constipation, diarrhea, yellow skin or eyes Genitourinary: Denies frequency, urgency, burning/pain, blood in urine, incontinence, change in urinary strength. Musculoskeletal: Denies muscle/joint pain, stiffness, back pain, redness or swelling of joints, trauma Skin: Denies rashes, lumps, itching, dryness, color changes, or hair/nail changes Neurological: Denies dizziness, fainting, seizures, weakness, numbness, tingling, tremor Psychiatric - Denies nervousness, stress, depression or memory loss Endocrine: Denies heat or cold intolerance, sweating, frequent urination, excessive thirst, changes in appetite Hematologic: Denies ease of bruising or bleeding     Objective:     BP 104/72 (BP Location: Left Arm, Patient Position: Sitting, Cuff Size: Normal)   Pulse (!) 16   Temp (!) 96 F (35.6 C) (Oral)   Resp 16   Ht  (1.575 m)   Wt 121 lb (54.9 kg)   SpO2 93%   BMI 22.13 kg/m  Nursing note and vital signs reviewed.  Wt Readings from Last 3 Encounters:  05/11/16 121 lb (54.9 kg)  03/29/16 124 lb 9.6 oz (56.5 kg)  11/09/15 131 lb 12.8 oz (59.8 kg)    Physical Exam  Constitutional: She is oriented to person, place, and time. She appears well-developed and well-nourished.  HENT:  Head: Normocephalic.  Right Ear: Hearing, tympanic membrane, external ear and ear canal normal.  Left Ear: Hearing, tympanic membrane, external ear and  ear canal normal.  Nose: Nose normal.  Mouth/Throat: Uvula is midline, oropharynx is clear and moist and mucous membranes are normal.  Eyes: Conjunctivae and EOM are normal. Pupils are equal, round, and reactive to light.  Neck: Neck supple. No JVD present. No tracheal deviation present. No thyromegaly present.  Cardiovascular: Normal rate, regular rhythm, normal heart sounds and intact distal pulses.   Pulmonary/Chest: Effort normal and breath sounds normal.  Abdominal: Soft. Bowel sounds are normal. She exhibits no distension and no mass. There is no tenderness. There is no rebound and no guarding.  Musculoskeletal: Normal range of motion. She exhibits no edema or tenderness.  Lymphadenopathy:    She has no cervical adenopathy.  Neurological: She  is alert and oriented to person, place, and time. She has normal reflexes. No cranial nerve deficit. She exhibits normal muscle tone. Coordination normal.  Skin: Skin is warm and dry.  Psychiatric: She has a normal mood and affect. Her behavior is normal. Judgment and thought content normal.       Assessment & Plan:   Problem List Items Addressed This Visit      Other   Routine adult health maintenance    1) Anticipatory Guidance: Discussed importance of wearing a seatbelt while driving and not texting while driving; changing batteries in smoke detector at least once annually; wearing suntan lotion when outside; eating a balanced and moderate diet; getting physical activity at least 30 minutes per day.  2) Immunizations / Screenings / Labs:  All immunizations are up-to-date per recommendations. Due for a dental and vision exam encouraged to be completed independently. Cervical cancer screening is up-to-date. Obtain lipid profile.  CBC and complete metabolic profile completed through gynecology.  Overall well exam with risk factors for cardiovascular disease being minimal at this time. Would recommend increasing number of meals per day to 2-3  she continues to experience some decreased appetite. Chronic conditions appear adequately controlled through medication regimens. Her goal is to go back to school to become a substance abuse counselor. Emphasize importance of remaining clean/sober. Continue current healthy lifestyle behaviors and choices. Follow-up prevention exam in 1 year. Follow-up office visit pending blood work.      Relevant Orders   Lipid panel       I have discontinued Ms. Postel's tretinoin, benzonatate, predniSONE, and guaiFENesin. I am also having her maintain her acyclovir, topiramate, Eluxadoline, sertraline, QUEtiapine Fumarate, propranolol, naltrexone, erythromycin with ethanol, and albuterol.   Follow-up: Return in about 1 year (around 05/11/2017), or if symptoms worsen or fail to improve.   Jeanine Luz, FNP

## 2016-06-12 ENCOUNTER — Encounter: Payer: BLUE CROSS/BLUE SHIELD | Admitting: Internal Medicine

## 2016-06-14 ENCOUNTER — Telehealth: Payer: Self-pay | Admitting: Family

## 2016-06-14 MED ORDER — ELUXADOLINE 100 MG PO TABS: 100.0000 mg | ORAL_TABLET | Freq: Two times a day (BID) | ORAL | 2 refills | Status: DC

## 2016-06-14 NOTE — Telephone Encounter (Signed)
Eluxadoline (VIBERZI) 100 MG TABS  Patient is requesting a refill on this medication. She believe the pharmacy was sending it to the wrong doctor requesting the refill. She states Joyce Rivas has always been the one to give her this medication. She has been out of it for a week now.   [id number] - [place], [place] - [address] [facility] [address] [phone] (Phone) [phone] (Fax)

## 2016-06-15 NOTE — Telephone Encounter (Signed)
Medication has been faxed

## 2016-06-30 ENCOUNTER — Ambulatory Visit (INDEPENDENT_AMBULATORY_CARE_PROVIDER_SITE_OTHER): Payer: Self-pay | Admitting: Nurse Practitioner

## 2016-06-30 VITALS — BP 104/58 | HR 88 | Temp 98.7°F | Resp 18 | Wt 123.4 lb

## 2016-06-30 DIAGNOSIS — N3000 Acute cystitis without hematuria: Secondary | ICD-10-CM

## 2016-06-30 LAB — POCT URINALYSIS DIPSTICK
Bilirubin, UA: NEGATIVE
Blood, UA: NEGATIVE
GLUCOSE UA: NEGATIVE
KETONES UA: NEGATIVE
Nitrite, UA: POSITIVE
Protein, UA: 100
SPEC GRAV UA: 1.01 (ref 1.010–1.025)
Urobilinogen, UA: 1 E.U./dL
pH, UA: 5 (ref 5.0–8.0)

## 2016-06-30 MED ORDER — CIPROFLOXACIN HCL 500 MG PO TABS: 500.0000 mg | ORAL_TABLET | Freq: Two times a day (BID) | ORAL | 0 refills | Status: AC

## 2016-06-30 MED ORDER — PHENAZOPYRIDINE HCL 100 MG PO TABS: 100.0000 mg | ORAL_TABLET | Freq: Three times a day (TID) | ORAL | 0 refills | Status: AC | PRN

## 2016-06-30 NOTE — Patient Instructions (Addendum)

## 2016-06-30 NOTE — Addendum Note (Signed)
Addended by: Nigel BertholdLATHRUM, Kehinde Totzke R on: 06/30/2016 06:02 PM   Modules accepted: Orders

## 2016-06-30 NOTE — Progress Notes (Deleted)
Patient ID: Joyce Rivas, female   DOB: [birth year], 26 y.o.   MRN:

## 2016-06-30 NOTE — Progress Notes (Signed)
   Subjective:    Patient ID: Joyce Rivas, female    DOB: [birth year], 26 y.o.   MRN: [id number]  The patient presents for complaints of dysuria and foul smelling urine.  Patient states she noticed the foul smell in her urine for the past 4-5 days and dysuria for the past 2 days.  Patient also states she now has noticed that her urine is cloudy.  The patient has taken AZO and takes it at least twice weekly.  Patient states she takes it when she develops a burning sensation with urination or if she feels like she has an UTI coming on.  The patient states she has recently started urinating after sex more.  The patient denies vaginal discharge.        Review of Systems  Constitutional: Positive for chills. Negative for fatigue and fever.  Respiratory: Negative.   Cardiovascular: Negative.   Gastrointestinal: Negative for abdominal distention, diarrhea, nausea and vomiting.       Denies flank pain.  Genitourinary: Positive for dyspareunia, dysuria, frequency and urgency.  Psychiatric/Behavioral: Negative.        Objective:   Physical Exam  Constitutional: She is oriented to person, place, and time. She appears well-developed and well-nourished. No distress.  HENT:  Head: Normocephalic and atraumatic.  Cardiovascular: Normal rate, regular rhythm and normal heart sounds.   Pulmonary/Chest: Effort normal and breath sounds normal.  Abdominal: Soft. Bowel sounds are normal. She exhibits no distension. There is tenderness.  LUQ and LLQ tenderness. No CVA tenderness.  Neurological: She is alert and oriented to person, place, and time.  Skin: Skin is warm and dry.  Psychiatric: She has a normal mood and affect. Her behavior is normal. Judgment and thought content normal.  Vitals reviewed.         Assessment & Plan:  Urinary Tract Infection.  Patient to take antibiotics as prescribed.  Patient to drink more water and add cranberry juice.  Patient will urinate at least 15-30 minutes after  coitus.  Patient will avoid tight fitting clothing and wear more cotton underwear.  Patient will also urinate more frequently.  Patient will go to ER if fever, increasing pain or sudden onset of flank pain.  Patient education provided. Patient verbalizes understanding.

## 2016-07-19 ENCOUNTER — Encounter: Payer: Self-pay | Admitting: Internal Medicine

## 2016-07-19 ENCOUNTER — Ambulatory Visit (INDEPENDENT_AMBULATORY_CARE_PROVIDER_SITE_OTHER): Payer: BLUE CROSS/BLUE SHIELD | Admitting: Internal Medicine

## 2016-07-19 ENCOUNTER — Other Ambulatory Visit: Payer: Self-pay | Admitting: Internal Medicine

## 2016-07-19 DIAGNOSIS — B182 Chronic viral hepatitis C: Secondary | ICD-10-CM

## 2016-07-19 NOTE — Patient Instructions (Addendum)
Date 07/19/16  Dear Ms Elio ForgetHoppe, As discussed in the ID Clinic, your hepatitis C therapy will include highly effective medication(s) for treatment and will vary based on the type of hepatitis C and insurance approval.  Potential medications include:          Harvoni (sofosbuvir 90mg /ledipasvir 400mg ) tablet oral daily          OR     Epclusa (sofosbuvir 400mg /velpatasvir 100mg ) tablet oral daily          OR      Mavyret (glecaprevir 100 mg/pibrentasvir 40 mg): Take 3 tablets oral daily                       Medications are typically for 8 or 12 weeks total  If positive HCV virus ---------------------------------------------------------------- Your HCV Treatment Start Date: You will be notified by our office once the medication is approved and where you can pick it up (or if mailed)   ---------------------------------------------------------------- YOUR PHARMACY CONTACT:   [facility] [address] [place], [place] [address] Phone: [phone] Hours: Monday to Friday 7:30 am to 6:00 pm   Please always contact your pharmacy at least 3-4 business days before you run out of medications to ensure your next month's medication is ready or 1 week prior to running out if you receive it by mail.  Remember, each prescription is for 28 days. ---------------------------------------------------------------- GENERAL NOTES REGARDING YOUR HEPATITIS C MEDICATION:  Some medications have the following interactions:  - Acid reducing agents such as H2 blockers (ie. Pepcid (famotidine), Zantac (ranitidine), Tagamet (cimetidine), Axid (nizatidine) and proton pump inhibitors (ie. Prilosec (omeprazole), Protonix (pantoprazole), Nexium (esomeprazole), or Aciphex (rabeprazole)). Do not take until you have discussed with a health care provider.    -Antacids that contain magnesium and/or aluminum hydroxide (ie. Milk of Magensia, Rolaids, Gaviscon, Maalox, Mylanta, an dArthritis Pain  Formula).  -Calcium carbonate (calcium supplements or antacids such as Tums, Caltrate, Os-Cal).  -St. John's wort or any products that contain St. John's wort like some herbal supplements  Please inform the office prior to starting any of these medications.  - The common side effects associated with Harvoni include:      1. Fatigue      2. Headache      3. Nausea      4. Diarrhea      5. Insomnia  Please note that this only lists the most common side effects and is NOT a comprehensive list of the potential side effects of these medications. For more information, please review the drug information sheets that come with your medication package from the pharmacy.  ---------------------------------------------------------------- GENERAL HELPFUL HINTS ON HCV THERAPY: 1. Stay well-hydrated. 2. Notify the ID Clinic of any changes in your other over-the-counter/herbal or prescription medications. 3. If you miss a dose of your medication, take the missed dose as soon as you remember. Return to your regular time/dose schedule the next day.  4.  Do not stop taking your medications without first talking with your healthcare provider. 5.  You may take Tylenol (acetaminophen), as long as the dose is less than 2000 mg (OR no more than 4 tablets of the Tylenol Extra Strengths 500mg  tablet) in 24 hours. 6.  You will see our pharmacist-specialist within the first 2 weeks of starting your medication to monitor for any possible side effects. 7.  You will have labs once during treatment, soon after treatment completion and one final lab 6 months after  treatment completion to verify the virus is out of your system.  Staci Righter, MD  [facility] [address] [place] [address] [place], [place]  [address] [phone]

## 2016-07-19 NOTE — Progress Notes (Signed)
[facility]   CC: consideration for treatment for chronic hepatitis C  HPI:  +Joyce Rivas is a 26 y.o. female who presents for initial evaluation and management of chronic hepatitis C.  Patient tested positive in 2016. Hepatitis C-associated risk factors present are: IV drug abuse (details: last use 3 years ago). Patient denies renal dialysis, tattoos. Patient has had other studies performed. Results: hepatitis C RNA by PCR, result: positive. Patient has not had prior treatment for Hepatitis C. Patient does not have a past history of liver disease. Patient does not have a family history of liver disease. Patient does not  have associated signs or symptoms related to liver disease.  Labs reviewed and confirm chronic hepatitis C with a positive viral load.   Records reviewed from OBGYN and positive labs from 2016.  No labs since that time in regards to HCV though reports she went somewhere for treatment and the viral load was too low to get genotype so they were unable to treat her.       Patient does not have documented immunity to Hepatitis A. Patient does not have documented immunity to Hepatitis B.    Review of Systems:  Constitutional: negative for fatigue and malaise Gastrointestinal: negative for diarrhea Integument/breast: negative for rash All other systems reviewed and are negative       Past Medical History:  Diagnosis Date  . Anxiety   . Asthma   . Chicken pox   . Depression   . Genital warts   . Hepatitis   . Heroin addiction (HCC)   . Hypertension   . Urinary tract infection     Prior to Admission medications   Medication Sig Start Date End Date Taking? Authorizing Provider  albuterol (PROVENTIL HFA;VENTOLIN HFA) 108 (90 Base) MCG/ACT inhaler Inhale 2 puffs into the lungs every 6 (six) hours as needed for wheezing or shortness of breath. 03/29/16   Withrow, Everardo AllJohn C, FNP  Eluxadoline (VIBERZI) 100 MG TABS Take 1 tablet (100 mg total) by  mouth 2 (two) times daily. 06/14/16   Veryl Speakalone, Gregory D, FNP  gabapentin (NEURONTIN) 400 MG capsule Take 400 mg by mouth 3 (three) times daily.    [provider]  naltrexone (DEPADE) 50 MG tablet Take 50 mg by mouth daily.    [provider]  propranolol (INDERAL) 10 MG tablet Take 10 mg by mouth 3 (three) times daily.    [provider]    No Known Allergies  Social History  Substance Use Topics  . Smoking status: Current Every Day Smoker    Packs/day: 1.00    Years: 10.00    Types: E-cigarettes    Last attempt to quit: 02/16/2014  . Smokeless tobacco: Never Used  . Alcohol use No    Family History  Problem Relation Age of Onset  . Drug abuse Mother   . Kidney cancer Mother   . Bladder Cancer Mother   . Drug abuse Father   . Stroke Father   . Hypertension Father   . Arthritis Maternal Grandmother   . Diabetes Paternal Grandmother      Objective:  Constitutional: in no apparent distress and alert,  Vitals:   07/19/16 0845  BP: 116/78  Pulse: 91  Temp: 98.4 F (36.9 C)   Eyes: anicteric Cardiovascular: Cor RRR Respiratory: CTA B; normal respiratory effort Gastrointestinal: Bowel sounds are normal, liver is not enlarged, spleen is not enlarged, soft, nt Musculoskeletal: no pedal edema noted Skin:  negatives: no rash; no porphyria cutanea tarda Lymphatic: no cervical lymphadenopathy   Laboratory Genotype: No results found for: HCVGENOTYPE HCV viral load: No results found for: HCVQUANT No results found for: WBC, HGB, HCT, MCV, PLT No results found for: CREATININE, BUN, NA, K, CL, CO2 No results found for: ALT, AST, GGT, ALKPHOS   Labs and history reviewed and show CHILD-PUGH unknown  5-6 points: Child class A 7-9 points: Child class B 10-15 points: Child class C  No results found for: INR, BILITOT, ALBUMIN   Assessment: New Patient with Chronic Hepatitis C genotype unknown, untreated.  I discussed with the patient the lab findings  that confirm chronic hepatitis C as well as the natural history and progression of disease including about 30% of people who develop cirrhosis of the liver if left untreated and once cirrhosis is established there is a 2-7% risk per year of liver cancer and liver failure.  I discussed the importance of treatment and benefits in reducing the risk, even if significant liver fibrosis exists.   Plan: 1) Patient counseled extensively on limiting acetaminophen to no more than 2 grams daily, avoidance of alcohol. 2) Transmission discussed with patient including sexual transmission, sharing razors and toothbrush.   3) Will need referral to gastroenterology if concern for cirrhosis 4) Will need referral for substance abuse counseling: No.; Further work up to include urine drug screen  No.  I am going to recheck her viral load with a genotype to see if it is still active or if she is one of the 20% who achieved spontaneous cure.  If active, will get her back for the rest of the labs, elastography, and get her on treatment.   She is aware she will need to be on birth control during treatment.  She will need a pregnancy test prior to initiation of medication.

## 2016-07-21 LAB — HCV RNA, QN PCR RFLX GENO, LIPA: HCV RNA, PCR, QN: <15 IU/mL

## 2016-07-27 ENCOUNTER — Telehealth: Payer: Self-pay

## 2016-07-27 NOTE — Telephone Encounter (Signed)
Patient called to discuss her Hepatitis C viral load lab that was recently released to her MyChart. I reviewed her records and informed her that HCV was not detected. She is likely one of the subset of patient's that is able to clear the virus on her own. I informed her that she will not require hepatitis C treatment but is still capable of being reinfected if she was to participate in high risk behavior in the future.  Casilda Carlsaylor Bush Murdoch, PharmD, BCPS PGY-2 Infectious Diseases Pharmacy Resident Pager: [phone] 07/27/2016, 9:32 AM

## 2016-07-31 NOTE — Telephone Encounter (Signed)
thanks

## 2016-11-17 ENCOUNTER — Encounter: Payer: Self-pay | Admitting: Nurse Practitioner

## 2016-11-17 ENCOUNTER — Ambulatory Visit (INDEPENDENT_AMBULATORY_CARE_PROVIDER_SITE_OTHER): Payer: BLUE CROSS/BLUE SHIELD | Admitting: Nurse Practitioner

## 2016-11-17 ENCOUNTER — Other Ambulatory Visit (INDEPENDENT_AMBULATORY_CARE_PROVIDER_SITE_OTHER): Payer: BLUE CROSS/BLUE SHIELD

## 2016-11-17 VITALS — BP 114/72 | HR 95 | Temp 98.0°F | Ht 62.0 in | Wt 130.0 lb

## 2016-11-17 DIAGNOSIS — L509 Urticaria, unspecified: Secondary | ICD-10-CM | POA: Diagnosis not present

## 2016-11-17 DIAGNOSIS — M25552 Pain in left hip: Secondary | ICD-10-CM | POA: Diagnosis not present

## 2016-11-17 DIAGNOSIS — N6452 Nipple discharge: Secondary | ICD-10-CM | POA: Diagnosis not present

## 2016-11-17 DIAGNOSIS — Z789 Other specified health status: Secondary | ICD-10-CM

## 2016-11-17 DIAGNOSIS — M25551 Pain in right hip: Secondary | ICD-10-CM

## 2016-11-17 DIAGNOSIS — J0140 Acute pansinusitis, unspecified: Secondary | ICD-10-CM

## 2016-11-17 DIAGNOSIS — J209 Acute bronchitis, unspecified: Secondary | ICD-10-CM

## 2016-11-17 LAB — SEDIMENTATION RATE: Sed Rate: 6 mm/hr (ref 0–20)

## 2016-11-17 MED ORDER — GUAIFENESIN-DM 100-10 MG/5ML PO SYRP: 5.0000 mL | ORAL_SOLUTION | ORAL | 0 refills | Status: DC | PRN

## 2016-11-17 MED ORDER — FLUTICASONE PROPIONATE 50 MCG/ACT NA SUSP: 2.0000 | Freq: Every day | NASAL | 0 refills | Status: DC

## 2016-11-17 MED ORDER — ALBUTEROL SULFATE HFA 108 (90 BASE) MCG/ACT IN AERS: 1.0000 | INHALATION_SPRAY | Freq: Four times a day (QID) | RESPIRATORY_TRACT | 1 refills | Status: AC | PRN

## 2016-11-17 MED ORDER — MUPIROCIN 2 % EX OINT: 1.0000 "application " | TOPICAL_OINTMENT | Freq: Two times a day (BID) | CUTANEOUS | 0 refills | Status: DC

## 2016-11-17 MED ORDER — AMOXICILLIN-POT CLAVULANATE 875-125 MG PO TABS: 1.0000 | ORAL_TABLET | Freq: Two times a day (BID) | ORAL | 0 refills | Status: DC

## 2016-11-17 NOTE — Progress Notes (Signed)
Subjective:  Patient ID: Joyce Rivas, female    DOB: 27-Apr-1990  Age: 26 y.o. MRN: [id number]  CC: Cough (coughing 2 wks. 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 ); Breast Discharge (nipple sore and yellow discharge going on for 2 years. ?); and Allergic Reaction (allergic to cat test?)   Cough  This is a recurrent problem. The current episode started 1 to 4 weeks ago. The problem has been waxing and waning. The problem occurs constantly. The cough is productive of purulent sputum. Associated symptoms include ear congestion, ear pain, nasal congestion, postnasal drip, rhinorrhea, a sore throat, shortness of breath and wheezing. Pertinent negatives include no chest pain. The symptoms are aggravated by lying down. Risk factors for lung disease include smoking/tobacco exposure. She has tried a beta-agonist inhaler and OTC cough suppressant for the symptoms. The treatment provided no relief. Her past medical history is significant for asthma, bronchitis and environmental allergies.  Hip Pain   Incident onset: ongoing for several years. There was no injury mechanism. The pain is present in the left thigh, left hip, right hip and right thigh. The quality of the pain is described as aching. The pain is moderate. The pain has been intermittent since onset. Pertinent negatives include no inability to bear weight, loss of motion, loss of sensation, muscle weakness, numbness or tingling. She reports no foreign bodies present. The symptoms are aggravated by movement and weight bearing. She has tried heat and NSAIDs (stretching exercise) for the symptoms.  hip pain is worse with prolong standing or hyperflexion of hips. Also Reports easy fatigue of leg muscles. 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 During that period she is not sure if she had pain due to being under the influence of ETOH and drugs. Reports she is now clean.  Noticed pain has been getting worse over several months.   current tobacco use.  Allergy Reaction: She will like referral to allergist to to be tested for any pet or environmental allergy. States she breaks out in hives each time she goes into a home to clean. She thinks this might be related to cats and mold. Denies any respiratory distress with hives.  Nipple discharge around piercing: Has bilateral nipple rings. Start having drainage after rings were inserted several year ago. No pain, no redness, no swelling.   Outpatient Medications Prior to Visit  Medication Sig Dispense Refill  . Eluxadoline (VIBERZI) 100 MG TABS Take 1 tablet (100 mg total) by mouth 2 (two) times daily. 60 tablet 2  . gabapentin (NEURONTIN) 400 MG capsule Take 400 mg by mouth 3 (three) times daily.    . naltrexone (DEPADE) 50 MG tablet Take 50 mg by mouth daily.    . propranolol (INDERAL) 10 MG tablet Take 10 mg by mouth 3 (three) times daily.    Marland Kitchen albuterol (PROVENTIL HFA;VENTOLIN HFA) 108 (90 Base) MCG/ACT inhaler Inhale 2 puffs into the lungs every 6 (six) hours as needed for wheezing or shortness of breath. 1 Inhaler 1   No facility-administered medications prior to visit.     ROS Review of Systems  HENT: Positive for ear pain, postnasal drip, rhinorrhea and sore throat.   Respiratory: Positive for cough, shortness of breath and wheezing.   Cardiovascular: Negative for chest pain.  Gastrointestinal: Negative for abdominal pain, constipation, diarrhea and melena.  Genitourinary: Negative for frequency and urgency.  Musculoskeletal: Positive for joint  pain. Negative for back pain and falls.  Neurological: Negative for tingling, sensory change, weakness and numbness.  Endo/Heme/Allergies: Positive for environmental allergies.    Objective:  BP 114/72   Pulse 95   Temp 98 F (36.7 C)   Ht 5' 2"  (1.575 m)   Wt 130 lb (59 kg)   SpO2 99%   BMI 23.78 kg/m   BP Readings from Last 3 Encounters:    11/17/16 114/72  07/19/16 116/78  06/30/16 (!) 104/58    Wt Readings from Last 3 Encounters:  11/17/16 130 lb (59 kg)  07/19/16 125 lb (56.7 kg)  06/30/16 123 lb 6.4 oz (56 kg)    Physical Exam  Constitutional: She is oriented to person, place, and time. No distress.  HENT:  Right Ear: Tympanic membrane, external ear and ear canal normal.  Left Ear: Tympanic membrane, external ear and ear canal normal.  Nose: Mucosal edema and rhinorrhea present. Right sinus exhibits maxillary sinus tenderness and frontal sinus tenderness. Left sinus exhibits maxillary sinus tenderness and frontal sinus tenderness.  Mouth/Throat: Uvula is midline. Posterior oropharyngeal erythema present. No oropharyngeal exudate.  Neck: Normal range of motion. Neck supple.  Cardiovascular: Normal rate.   Pulmonary/Chest: Effort normal. She exhibits no tenderness and no bony tenderness. Right breast exhibits no inverted nipple, no mass, no nipple discharge, no skin change and no tenderness. Left breast exhibits no inverted nipple, no mass, no nipple discharge, no skin change and no tenderness. Breasts are symmetrical.  Nipple rings present  Musculoskeletal: She exhibits no edema, tenderness or deformity.       Right hip: Normal.       Left hip: Normal.       Right knee: Normal.       Left knee: Normal.       Lumbar back: Normal.       Right upper leg: Normal.       Left upper leg: Normal.  Lymphadenopathy:    She has no cervical adenopathy.  Neurological: She is alert and oriented to person, place, and time.  Skin: Skin is warm and dry. No rash noted. No erythema.  Psychiatric: She has a normal mood and affect. Her behavior is normal.  Vitals reviewed.   Lab Results  Component Value Date   CHOL 138 05/11/2016   TRIG 40.0 05/11/2016   HDL 47.90 05/11/2016   LDLCALC 82 05/11/2016    No results found.  Assessment & Plan:   Aditi was seen today for cough, pain, breast discharge and allergic  reaction.  Diagnoses and all orders for this visit:  Acute bronchitis, unspecified organism -     albuterol (PROVENTIL HFA;VENTOLIN HFA) 108 (90 Base) MCG/ACT inhaler; Inhale 1-2 puffs into the lungs every 6 (six) hours as needed. -     amoxicillin-clavulanate (AUGMENTIN) 875-125 MG tablet; Take 1 tablet by mouth 2 (two) times daily.  Hives -     Ambulatory referral to Allergy  Pain of both hip joints -     Sed Rate (ESR); Future -     Antinuclear Antib (ANA); Future -     Ambulatory referral to Sports Medicine  Nipple discharge in female -     mupirocin ointment (BACTROBAN) 2 %; Apply 1 application topically 2 (two) times daily. Apply with dressing change  Body piercing -     mupirocin ointment (BACTROBAN) 2 %; Apply 1 application topically 2 (two) times daily. Apply with dressing change  Acute non-recurrent pansinusitis -  fluticasone (FLONASE) 50 MCG/ACT nasal spray; Place 2 sprays into both nostrils daily. -     guaiFENesin-dextromethorphan (ROBITUSSIN DM) 100-10 MG/5ML syrup; Take 5 mLs by mouth every 4 (four) hours as needed for cough.   I have discontinued Ms. Narine's albuterol. I am also having her start on mupirocin ointment, albuterol, amoxicillin-clavulanate, fluticasone, and guaiFENesin-dextromethorphan. Additionally, I am having her maintain her propranolol, naltrexone, Eluxadoline, gabapentin, sertraline, and topiramate.  Meds ordered this encounter  Medications  . sertraline (ZOLOFT) 50 MG tablet    Sig: Take 50 mg by mouth at bedtime.    Refill:  0  . topiramate (TOPAMAX) 200 MG tablet    Sig: topiramate 200 mg tablet  . mupirocin ointment (BACTROBAN) 2 %    Sig: Apply 1 application topically 2 (two) times daily. Apply with dressing change    Dispense:  15 g    Refill:  0    Order Specific Question:   Supervising Provider    Answer:   Binnie Rail [[id number]]  . albuterol (PROVENTIL HFA;VENTOLIN HFA) 108 (90 Base) MCG/ACT inhaler    Sig: Inhale 1-2 puffs  into the lungs every 6 (six) hours as needed.    Dispense:  2 Inhaler    Refill:  1    Order Specific Question:   Supervising Provider    Answer:   Binnie Rail [[id number]]  . amoxicillin-clavulanate (AUGMENTIN) 875-125 MG tablet    Sig: Take 1 tablet by mouth 2 (two) times daily.    Dispense:  20 tablet    Refill:  0    Order Specific Question:   Supervising Provider    Answer:   Binnie Rail [[id number]]  . fluticasone (FLONASE) 50 MCG/ACT nasal spray    Sig: Place 2 sprays into both nostrils daily.    Dispense:  16 g    Refill:  0    Order Specific Question:   Supervising Provider    Answer:   Binnie Rail [[id number]]  . guaiFENesin-dextromethorphan (ROBITUSSIN DM) 100-10 MG/5ML syrup    Sig: Take 5 mLs by mouth every 4 (four) hours as needed for cough.    Dispense:  118 mL    Refill:  0    Order Specific Question:   Supervising Provider    Answer:   Binnie Rail [[id number]]    Follow-up: Return if symptoms worsen or fail to improve.  Wilfred Lacy, NP

## 2016-11-17 NOTE — Patient Instructions (Addendum)
You will be contacted to schedule appt with allegist.  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  Encourage adequate oral hydration.  stop tobacco use.  Continue hip exercise and use naproxen for pain.  Go to basement for blood draw.  She declined referral to PT due to possible high cost. I have entered referral to sports medicine.

## 2016-11-20 ENCOUNTER — Telehealth: Payer: Self-pay | Admitting: *Deleted

## 2016-11-20 DIAGNOSIS — M25552 Pain in left hip: Secondary | ICD-10-CM

## 2016-11-20 DIAGNOSIS — M25551 Pain in right hip: Secondary | ICD-10-CM | POA: Insufficient documentation

## 2016-11-20 DIAGNOSIS — Z789 Other specified health status: Secondary | ICD-10-CM | POA: Insufficient documentation

## 2016-11-20 DIAGNOSIS — N6452 Nipple discharge: Secondary | ICD-10-CM | POA: Insufficient documentation

## 2016-11-20 LAB — ANA: ANA: NEGATIVE

## 2016-11-20 NOTE — Telephone Encounter (Signed)
Ok to refill 

## 2016-11-20 NOTE — Telephone Encounter (Signed)
Rec'd fax pt requesting refill on her Acyclovir 400 mg  Take 1 tablet twice a day. Last filled 06/13/06.Tammy SoursGreg is no longer here pt saw Claris Gowerharlotte on 10/19. Pls advise if k to refill.Marland Kitchen.Raechel Chute/lmb

## 2016-11-21 MED ORDER — ACYCLOVIR 400 MG PO TABS: 400.0000 mg | ORAL_TABLET | Freq: Two times a day (BID) | ORAL | 2 refills | Status: AC

## 2016-11-21 NOTE — Telephone Encounter (Signed)
Refill has been sent.../lmb 

## 2016-12-14 ENCOUNTER — Ambulatory Visit: Payer: BLUE CROSS/BLUE SHIELD | Admitting: Family Medicine

## 2016-12-14 ENCOUNTER — Encounter: Payer: Self-pay | Admitting: Family Medicine

## 2016-12-14 DIAGNOSIS — M24559 Contracture, unspecified hip: Secondary | ICD-10-CM | POA: Insufficient documentation

## 2016-12-14 DIAGNOSIS — M24551 Contracture, right hip: Secondary | ICD-10-CM | POA: Diagnosis not present

## 2016-12-14 NOTE — Progress Notes (Signed)
Tawana ScaleZach Smith D.O. [facility] [address] [place], [place] [address] Phone: [phone] Subjective:    I'm seeing this patient by the request  of:  Mche, NP  CC: Bilateral hip pain  FAO:[id number]:Subjective  Joyce Rivas is a 26 y.o. female coming in with complaint of bilateral hip pain.  Feels like it has been going on for years.  Pain is mostly in the left hip and thighs.  Describes it as an aching sensation that is moderate in severity.  Seems to be more intermittent.  Patient denies any significant radiation or numbness or weakness.  Does seem to be worse with weightbearing.  Has tried over-the-counter anti-inflammatories with mild improvement.  Patient states that sometimes she gets pain in her lower back but its mostly in her legs (lateral).  Patient states symptoms he can be severe enough that is 8 out of 10.       Past Medical History:  Diagnosis Date  . Anxiety   . Asthma   . Chicken pox   . Depression   . Genital warts   . Hepatitis   . Heroin addiction (HCC)   . Hypertension   . Urinary tract infection    Past Surgical History:  Procedure Laterality Date  . wisdom teeth removal     Social History   Socioeconomic History  . Marital status: Single    Spouse name: None  . Number of children: 0  . Years of education: 5711  . Highest education level: None  Social Needs  . Financial resource strain: None  . Food insecurity - worry: None  . Food insecurity - inability: None  . Transportation needs - medical: None  . Transportation needs - non-medical: None  Occupational History  . Occupation: Engineer, waterCleaner   Tobacco Use  . Smoking status: Current Every Day Smoker    Packs/day: 1.00    Years: 10.00    Pack years: 10.00    Types: E-cigarettes    Last attempt to quit: 02/16/2014    Years since quitting: 2.8  . Smokeless tobacco: Never Used  Substance and Sexual Activity  . Alcohol use: No    Alcohol/week: 0.0 oz  . Drug use: No  . Sexual activity: Yes   Partners: Male  Other Topics Concern  . None  Social History Narrative   Born [place] Lives with her husband. No pets. Fun: Dance and sing.   Denies any religious beliefs effecting healthcare.    No Known Allergies Family History  Problem Relation Age of Onset  . Drug abuse Mother   . Kidney cancer Mother   . Bladder Cancer Mother   . Drug abuse Father   . Stroke Father   . Hypertension Father   . Arthritis Maternal Grandmother   . Diabetes Paternal Grandmother      Past medical history, social, surgical and family history all reviewed in electronic medical record.  No pertanent information unless stated regarding to the chief complaint.   Review of Systems:Review of systems updated and as accurate as of 12/14/16  No headache, visual changes, nausea, vomiting, diarrhea, constipation, dizziness, abdominal pain, skin rash, fevers, chills, night sweats, weight loss, swollen lymph nodes, body aches, joint swelling, chest pain, shortness of breath, mood changes.  muscle aches  Objective  Blood pressure 100/68, pulse 93, height 5\' 2"  (1.575 m), weight 134 lb (60.8 kg), SpO2 98 %. Systems examined below as of 12/14/16   General: No apparent  distress alert and oriented x3 mood and affect normal, dressed appropriately.  HEENT: Pupils equal, extraocular movements intact  Respiratory: Patient's speak in full sentences and does not appear short of breath  Cardiovascular: No lower extremity edema, non tender, no erythema  Skin: Warm dry intact with no signs of infection or rash on extremities or on axial skeleton.  Abdomen: Soft nontender  Neuro: Cranial nerves II through XII are intact, neurovascularly intact in all extremities with 2+ DTRs and 2+ pulses.  Lymph: No lymphadenopathy of posterior or anterior cervical chain or axillae bilaterally.  Gait normal with good balance and coordination.  MSK:  Non tender with full range of motion and good  stability and symmetric strength and tone of shoulders, elbows, wrist, hip, knee and ankles bilaterally.  Hip: Right ROM IR: 25 Deg some pain with internal rotation, ER: 45 Deg, Flexion: 120 Deg, Extension: 100 Deg, Abduction: 45 Deg, Adduction: 45 Deg Strength IR: 5/5, ER: 5/5, Flexion: 5/5, Extension: 5/5, Abduction: 5/5, Adduction: 5/5 Pelvic alignment unremarkable to inspection and palpation. Standing hip rotation and gait without trendelenburg sign / unsteadiness. Greater trochanter without tenderness to palpation. No tenderness over piriformis and greater trochanter. Pain with internal rotation Pain over the right sacroiliac joint  Procedure 97110; 15 additional minutes spent for Therapeutic exercises as stated in above notes.  This included exercises focusing on stretching, strengthening, with significant focus on eccentric aspects.   Long term goals include an improvement in range of motion, strength, endurance as well as avoiding reinjury. Patient's frequency would include in 1-2 times a day, 3-5 times a week for a duration of 6-12 weeks. Sacroiliac Joint Mobilization and Rehab 1. Work on pretzel stretching, shoulder back and leg draped in front. 3-5 sets, 30 sec.. 2. hip abductor rotations. standing, hip flexion and rotation outward then inward. 3 sets, 15 reps. when can do comfortably, add ankle weights starting at 2 pounds.  3. cross over stretching - shoulder back to ground, same side leg crossover. 3-5 sets for 30 min..  4. rolling up and back knees to chest and rocking. 5. sacral tilt - 5 sets, hold for 5-10 seconds   Proper technique shown and discussed handout in great detail with ATC.  All questions were discussed and answered.     Impression and Recommendations:     This case required medical decision making of moderate complexity.      Note: This dictation was prepared with Dragon dictation along with smaller phrase technology. Any transcriptional errors that  result from this process are unintentional.

## 2016-12-14 NOTE — Patient Instructions (Signed)
Good to see you  Ice 20 minutes 2 times daily. Usually after activity and before bed. Exercises 3 times a week.  pennsaid pinkie amount topically 2 times daily as needed.  Make sure prenatal 65mg  daily of iron and 2000 IU daily of vitamin D See me again in 4 weeks to make sure you are doing better

## 2016-12-14 NOTE — Assessment & Plan Note (Signed)
Patient is will have a hip flexor tightness.  Given exercises today.  Topical anti-inflammatories.  Patient warned to watch for any other type of potential pelvic discharge.  We discussed the possibility of testing for any type of pregnancy but patient did state that she had a urine test recently.  Is taking medications to potentially increase ovulation.  We warned that this can give some abnormal pelvic pain as well.  Patient will try more of an icing regimen.  Will come back and see me again in 4 weeks.

## 2017-01-09 NOTE — Progress Notes (Deleted)
Joyce ScaleZach Marnell Rivas D.O. [facility] [address] [place], [place] [address] Phone: [phone] Subjective:    I'm seeing this patient by the request  of:    CC: Hip and back pain follow-up  BJY:[id number]:Subjective  Joyce Rivas is a 26 y.o. female coming in with complaint of hip and back pain.  Found to have significant hip flexor tightness.  Patient was given home exercises, icing regimen, which activities to do which wants to avoid.  Patient was already on gabapentin at a fairly good dose.  Patient states      Past Medical History:  Diagnosis Date  . Anxiety   . Asthma   . Chicken pox   . Depression   . Genital warts   . Hepatitis   . Heroin addiction (HCC)   . Hypertension   . Urinary tract infection    Past Surgical History:  Procedure Laterality Date  . wisdom teeth removal     Social History   Socioeconomic History  . Marital status: Single    Spouse name: Not on file  . Number of children: 0  . Years of education: 7011  . Highest education level: Not on file  Social Needs  . Financial resource strain: Not on file  . Food insecurity - worry: Not on file  . Food insecurity - inability: Not on file  . Transportation needs - medical: Not on file  . Transportation needs - non-medical: Not on file  Occupational History  . Occupation: Engineer, waterCleaner   Tobacco Use  . Smoking status: Current Every Day Smoker    Packs/day: 1.00    Years: 10.00    Pack years: 10.00    Types: E-cigarettes    Last attempt to quit: 02/16/2014    Years since quitting: 2.8  . Smokeless tobacco: Never Used  Substance and Sexual Activity  . Alcohol use: No    Alcohol/week: 0.0 oz  . Drug use: No  . Sexual activity: Yes    Partners: Male  Other Topics Concern  . Not on file  Social History Narrative   [place] and [place], [place]. Lives with her husband. No pets. Fun: Dance and sing.   Denies any religious beliefs effecting healthcare.    No Known Allergies Family  History  Problem Relation Age of Onset  . Drug abuse Mother   . Kidney cancer Mother   . Bladder Cancer Mother   . Drug abuse Father   . Stroke Father   . Hypertension Father   . Arthritis Maternal Grandmother   . Diabetes Paternal Grandmother      Past medical history, social, surgical and family history all reviewed in electronic medical record.  No pertanent information unless stated regarding to the chief complaint.   Review of Systems:Review of systems updated and as accurate as of 01/09/17  No headache, visual changes, nausea, vomiting, diarrhea, constipation, dizziness, abdominal pain, skin rash, fevers, chills, night sweats, weight loss, swollen lymph nodes, body aches, joint swelling, muscle aches, chest pain, shortness of breath, mood changes.   Objective  There were no vitals taken for this visit. Systems examined below as of 01/09/17   General: No apparent distress alert and oriented x3 mood and affect normal, dressed appropriately.  HEENT: Pupils equal, extraocular movements intact  Respiratory: Patient's speak in full sentences and does not appear short of breath  Cardiovascular: No lower extremity edema, non tender, no erythema  Skin: Warm dry intact with no signs of  infection or rash on extremities or on axial skeleton.  Abdomen: Soft nontender  Neuro: Cranial nerves II through XII are intact, neurovascularly intact in all extremities with 2+ DTRs and 2+ pulses.  Lymph: No lymphadenopathy of posterior or anterior cervical chain or axillae bilaterally.  Gait normal with good balance and coordination.  MSK:  Non tender with full range of motion and good stability and symmetric strength and tone of shoulders, elbows, wrist, hip, knee and ankles bilaterally.     Impression and Recommendations:     This case required medical decision making of moderate complexity.      Note: This dictation was prepared with Dragon dictation along with smaller phrase technology.  Any transcriptional errors that result from this process are unintentional.

## 2017-01-10 ENCOUNTER — Ambulatory Visit: Payer: BLUE CROSS/BLUE SHIELD | Admitting: Family Medicine

## 2017-02-12 ENCOUNTER — Ambulatory Visit: Payer: BLUE CROSS/BLUE SHIELD | Admitting: Family Medicine

## 2017-02-12 NOTE — Progress Notes (Deleted)
Tawana ScaleZach Akshara Blumenthal D.O. [facility] [address] [place], [place] [address] Phone: [phone] Subjective:    I'm seeing this patient by the request  of:    CC: Back pain follow-up  BJY:NWGNFAOZHYHPI:Subjective  Joyce Rivas is a 27 y.o. female coming in with complaint of back pain.  Patient was seen previously and had more of a hip flexor tightness.  Given home exercises, icing regimen, we discussed different exercises.  Patient states  Onset-  Location Duration-  Character- Aggravating factors- Reliving factors-  Therapies tried-  Severity-     Past Medical History:  Diagnosis Date  . Anxiety   . Asthma   . Chicken pox   . Depression   . Genital warts   . Hepatitis   . Heroin addiction (HCC)   . Hypertension   . Urinary tract infection    Past Surgical History:  Procedure Laterality Date  . wisdom teeth removal     Social History   Socioeconomic History  . Marital status: Single    Spouse name: Not on file  . Number of children: 0  . Years of education: 7711  . Highest education level: Not on file  Social Needs  . Financial resource strain: Not on file  . Food insecurity - worry: Not on file  . Food insecurity - inability: Not on file  . Transportation needs - medical: Not on file  . Transportation needs - non-medical: Not on file  Occupational History  . Occupation: Engineer, waterCleaner   Tobacco Use  . Smoking status: Current Every Day Smoker    Packs/day: 1.00    Years: 10.00    Pack years: 10.00    Types: E-cigarettes    Last attempt to quit: 02/16/2014    Years since quitting: 2.9  . Smokeless tobacco: Never Used  Substance and Sexual Activity  . Alcohol use: No    Alcohol/week: 0.0 oz  . Drug use: No  . Sexual activity: Yes    Partners: Male  Other Topics Concern  . Not on file  Social History Narrative   Born in [place], [place] and [place] in [place], [place]. Lives with her husband. No pets. Fun: Dance and sing.   Denies any religious beliefs effecting  healthcare.    No Known Allergies Family History  Problem Relation Age of Onset  . Drug abuse Mother   . Kidney cancer Mother   . Bladder Cancer Mother   . Drug abuse Father   . Stroke Father   . Hypertension Father   . Arthritis Maternal Grandmother   . Diabetes Paternal Grandmother      Past medical history, social, surgical and family history all reviewed in electronic medical record.  No pertanent information unless stated regarding to the chief complaint.   Review of Systems:Review of systems updated and as accurate as of 02/12/17  No headache, visual changes, nausea, vomiting, diarrhea, constipation, dizziness, abdominal pain, skin rash, fevers, chills, night sweats, weight loss, swollen lymph nodes, body aches, joint swelling, muscle aches, chest pain, shortness of breath, mood changes.   Objective  There were no vitals taken for this visit. Systems examined below as of 02/12/17   General: No apparent distress alert and oriented x3 mood and affect normal, dressed appropriately.  HEENT: Pupils equal, extraocular movements intact  Respiratory: Patient's speak in full sentences and does not appear short of breath  Cardiovascular: No lower extremity edema, non tender, no erythema  Skin: Warm dry intact with no signs of infection  or rash on extremities or on axial skeleton.  Abdomen: Soft nontender  Neuro: Cranial nerves II through XII are intact, neurovascularly intact in all extremities with 2+ DTRs and 2+ pulses.  Lymph: No lymphadenopathy of posterior or anterior cervical chain or axillae bilaterally.  Gait normal with good balance and coordination.  MSK:  Non tender with full range of motion and good stability and symmetric strength and tone of shoulders, elbows, wrist, hip, knee and ankles bilaterally.     Impression and Recommendations:     This case required medical decision making of moderate complexity.      Note: This dictation was prepared with Dragon  dictation along with smaller phrase technology. Any transcriptional errors that result from this process are unintentional.

## 2017-03-07 ENCOUNTER — Telehealth: Payer: Self-pay | Admitting: Family

## 2017-03-19 ENCOUNTER — Other Ambulatory Visit: Payer: Self-pay | Admitting: Nurse Practitioner

## 2017-03-19 NOTE — Telephone Encounter (Signed)
Pls advise on refill for Viberzi.Marland Kitchen.Raechel Chute/lmb

## 2017-03-19 NOTE — Telephone Encounter (Signed)
Patient is scheduled to Joyce Rivas for 05/31/2017, can a refill be sent to get her till she is seen? Please advise. [phone]

## 2017-03-19 NOTE — Telephone Encounter (Signed)
Pt called to f/u on request for meds. She states it is for IBS with diarrhea. Pt has been out for 2 weeks. Pt states new doc not assigned to her since Dr. Carver Filaalone left. Pt is requesting refill Viberzi and will schedule appt if needed moving forward. Please advise.  [id number] [place], [place] - [address] [facility] [address] [phone] (Phone) [phone])   Pt call back [phone]

## 2017-03-19 NOTE — Telephone Encounter (Signed)
Called and LVM to inform patient she does not a PCP at this time. Calone left the office last fall. She would need to call and [facility] to get [facility]. She could sent this up as her CPE as well, she is due for one after 05/11/16.

## 2017-03-20 NOTE — Telephone Encounter (Signed)
[facility]

## 2017-03-20 NOTE — Telephone Encounter (Signed)
Refill sent.

## 2017-04-30 ENCOUNTER — Other Ambulatory Visit: Payer: Self-pay | Admitting: Nurse Practitioner

## 2017-04-30 NOTE — Telephone Encounter (Signed)
Last OV: 05/11/16; Upcoming 06/28/16-establish care PCP: [id number] [place], [place] - [address] [facility] [phone] (Phone) [phone] (Fax)

## 2017-04-30 NOTE — Telephone Encounter (Signed)
Copied from CRM [id number]. Topic: Quick Communication - Rx Refill/Question >> Apr 30, 2017 12:22 PM Arlyss Gandyichardson, Kyrus Hyde N, NT wrote: Medication: propranolol (INDERAL) 90 day supply Has the patient contacted their pharmacy? Yes.   (Agent: If no, request that the patient contact the pharmacy for the refill.) Preferred Pharmacy (with phone number or street name[facility] [address] [facility] [place] Agent: Please be advised that RX refills may take up to 3 business days. We ask that you follow-up with your pharmacy.

## 2017-05-01 MED ORDER — PROPRANOLOL HCL 10 MG PO TABS: 10.0000 mg | ORAL_TABLET | Freq: Three times a day (TID) | ORAL | 0 refills | Status: AC

## 2017-05-01 NOTE — Telephone Encounter (Signed)
Per office policy sent 30 day to local pharmacy until appt.../lmb  

## 2017-05-28 ENCOUNTER — Other Ambulatory Visit (HOSPITAL_COMMUNITY): Payer: Self-pay | Admitting: Obstetrics & Gynecology

## 2017-05-28 DIAGNOSIS — O09891 Supervision of other high risk pregnancies, first trimester: Secondary | ICD-10-CM

## 2017-05-28 DIAGNOSIS — O3680X0 Pregnancy with inconclusive fetal viability, not applicable or unspecified: Secondary | ICD-10-CM

## 2017-05-31 ENCOUNTER — Encounter: Payer: BLUE CROSS/BLUE SHIELD | Admitting: Nurse Practitioner

## 2017-06-04 ENCOUNTER — Ambulatory Visit (HOSPITAL_COMMUNITY)
Admission: RE | Admit: 2017-06-04 | Discharge: 2017-06-04 | Disposition: A | Discharge status: Home / Self Care | Payer: Medicaid Other | Source: Ambulatory Visit | Attending: Obstetrics & Gynecology | Admitting: Obstetrics & Gynecology

## 2017-06-04 DIAGNOSIS — Z3A01 Less than 8 weeks gestation of pregnancy: Secondary | ICD-10-CM | POA: Insufficient documentation

## 2017-06-04 DIAGNOSIS — Z369 Encounter for antenatal screening, unspecified: Secondary | ICD-10-CM | POA: Insufficient documentation

## 2017-06-04 DIAGNOSIS — O209 Hemorrhage in early pregnancy, unspecified: Secondary | ICD-10-CM | POA: Diagnosis not present

## 2017-06-04 DIAGNOSIS — O09891 Supervision of other high risk pregnancies, first trimester: Secondary | ICD-10-CM

## 2017-06-04 DIAGNOSIS — O3680X0 Pregnancy with inconclusive fetal viability, not applicable or unspecified: Secondary | ICD-10-CM

## 2017-06-23 ENCOUNTER — Encounter (HOSPITAL_COMMUNITY): Payer: Self-pay | Admitting: Emergency Medicine

## 2017-06-23 ENCOUNTER — Emergency Department (HOSPITAL_COMMUNITY)
Admission: EM | Admit: 2017-06-23 | Discharge: 2017-06-24 | Disposition: A | Discharge status: Home / Self Care | Payer: Medicaid Other | Attending: Emergency Medicine | Admitting: Emergency Medicine

## 2017-06-23 ENCOUNTER — Other Ambulatory Visit: Payer: Self-pay

## 2017-06-23 DIAGNOSIS — O209 Hemorrhage in early pregnancy, unspecified: Secondary | ICD-10-CM

## 2017-06-23 DIAGNOSIS — Z79899 Other long term (current) drug therapy: Secondary | ICD-10-CM | POA: Diagnosis not present

## 2017-06-23 DIAGNOSIS — F17290 Nicotine dependence, other tobacco product, uncomplicated: Secondary | ICD-10-CM | POA: Insufficient documentation

## 2017-06-23 DIAGNOSIS — Z3A09 9 weeks gestation of pregnancy: Secondary | ICD-10-CM | POA: Insufficient documentation

## 2017-06-23 DIAGNOSIS — O26891 Other specified pregnancy related conditions, first trimester: Secondary | ICD-10-CM | POA: Diagnosis present

## 2017-06-23 DIAGNOSIS — O468X1 Other antepartum hemorrhage, first trimester: Secondary | ICD-10-CM | POA: Insufficient documentation

## 2017-06-23 DIAGNOSIS — J45909 Unspecified asthma, uncomplicated: Secondary | ICD-10-CM | POA: Diagnosis not present

## 2017-06-23 DIAGNOSIS — O418X10 Other specified disorders of amniotic fluid and membranes, first trimester, not applicable or unspecified: Secondary | ICD-10-CM | POA: Diagnosis not present

## 2017-06-23 DIAGNOSIS — I10 Essential (primary) hypertension: Secondary | ICD-10-CM | POA: Diagnosis not present

## 2017-06-23 DIAGNOSIS — R102 Pelvic and perineal pain: Secondary | ICD-10-CM | POA: Diagnosis not present

## 2017-06-23 NOTE — ED Triage Notes (Signed)
Pt reports that vaginal bleeding started around 2230 tonight and pt is currently [redacted] weeks pregnant. G2 P0 A1

## 2017-06-24 ENCOUNTER — Emergency Department (HOSPITAL_COMMUNITY): Payer: Medicaid Other

## 2017-06-24 LAB — CBC WITH DIFFERENTIAL/PLATELET
BASOS ABS: 0 10*3/uL (ref 0.0–0.1)
BASOS PCT: 0 %
EOS ABS: 0.2 10*3/uL (ref 0.0–0.7)
Eosinophils Relative: 1 %
HCT: 37.3 % (ref 36.0–46.0)
Hemoglobin: 12.9 g/dL (ref 12.0–15.0)
Lymphocytes Relative: 19 %
Lymphs Abs: 2.5 10*3/uL (ref 0.7–4.0)
MCH: 31.3 pg (ref 26.0–34.0)
MCHC: 34.6 g/dL (ref 30.0–36.0)
MCV: 90.5 fL (ref 78.0–100.0)
Monocytes Absolute: 0.7 10*3/uL (ref 0.1–1.0)
Monocytes Relative: 6 %
Neutro Abs: 9.8 10*3/uL — ABNORMAL HIGH (ref 1.7–7.7)
Neutrophils Relative %: 74 %
Platelets: 247 10*3/uL (ref 150–400)
RBC: 4.12 MIL/uL (ref 3.87–5.11)
RDW: 12.5 % (ref 11.5–15.5)
WBC: 13.2 10*3/uL — AB (ref 4.0–10.5)

## 2017-06-24 LAB — ABO/RH: ABO/RH(D): O POS

## 2017-06-24 LAB — HIV ANTIBODY (ROUTINE TESTING W REFLEX): HIV SCREEN 4TH GENERATION: NONREACTIVE

## 2017-06-24 LAB — HCG, QUANTITATIVE, PREGNANCY: HCG, BETA CHAIN, QUANT, S: 232556 m[IU]/mL — AB (ref <5)

## 2017-06-24 LAB — RPR: RPR: NONREACTIVE

## 2017-06-24 MED ORDER — ACETAMINOPHEN 325 MG PO TABS
650.0000 mg | ORAL_TABLET | Freq: Once | ORAL | Status: AC
  Administered 2017-06-24: 650 mg via ORAL
  Filled 2017-06-24: qty 2

## 2017-06-24 NOTE — Discharge Instructions (Signed)
Your HCG level today was

## 2017-06-24 NOTE — ED Provider Notes (Signed)
[facility]-EMERGENCY DEPT Provider Note   CSN: [id number] Arrival date & time: 06/23/17  2348     History   Chief Complaint Chief Complaint  Patient presents with  . Vaginal Bleeding    HPI Joyce Rivas is a 27 y.o. female.  The history is provided by the patient.  Vaginal Bleeding  Primary symptoms include vaginal bleeding.  She has history of hypertension, asthma, heroin addiction and is pregnant with last menstrual period March 15.  She started having vaginal bleeding tonight at 10 PM.  She has been passing clots.  She has saturated 2 pads since bleeding started.  There is associated cramping and now she rates her pain at 2/10.  Pregnancy had been uncomplicated to this point, and she had started prenatal care.  She is gravida 2, para 0 with prior miscarriage 10 years ago.  Past Medical History:  Diagnosis Date  . Anxiety   . Asthma   . Chicken pox   . Depression   . Genital warts   . Hepatitis   . Heroin addiction (HCC)   . Hypertension   . Urinary tract infection     Patient Active Problem List   Diagnosis Date Noted  . Hip flexor tightness 12/14/2016  . Body piercing 11/20/2016  . Nipple discharge in female 11/20/2016  . Pain of both hip joints 11/20/2016  . Chronic hepatitis C without hepatic coma (HCC) 07/19/2016  . Routine adult health maintenance 05/11/2016  . Viral pharyngitis 03/29/2016  . Poison ivy 09/15/2015  . Canker sore 11/10/2014  . Cold sore 04/29/2014  . Generalized anxiety disorder 04/01/2014  . 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 Acne 04/01/2014    Past Surgical History:  Procedure Laterality Date  . wisdom teeth removal       OB History    Gravida  3   Para  0   Term  0   Preterm  0   AB  1   Living        SAB      TAB      Ectopic      Multiple      Live Births               Home Medications    Prior to Admission  medications   Medication Sig Start Date End Date Taking? Authorizing Provider  acyclovir (ZOVIRAX) 400 MG tablet Take 1 tablet (400 mg total) by mouth 2 (two) times daily. 11/21/16  Yes Nche, Bonna Gains, NP  albuterol (PROVENTIL HFA;VENTOLIN HFA) 108 (90 Base) MCG/ACT inhaler Inhale 1-2 puffs into the lungs every 6 (six) hours as needed. Patient taking differently: Inhale 1-2 puffs into the lungs every 6 (six) hours as needed for wheezing or shortness of breath.  11/17/16  Yes Nche, Bonna Gains, NP  busPIRone (BUSPAR) 7.5 MG tablet Take 7.5 mg by mouth 2 (two) times daily. 05/30/17  Yes [provider]  cefpodoxime (VANTIN) 100 MG tablet Take 100 mg by mouth 2 (two) times daily.   Yes [provider]  gabapentin (NEURONTIN) 400 MG capsule Take 400 mg by mouth 3 (three) times daily.   Yes [provider]  propranolol (INDERAL) 10 MG tablet Take 1 tablet (10 mg total) by mouth 3 (three) times daily. Must keep appt w/new provider for future refills 05/01/17  Yes Evaristo Bury, NP  sertraline (ZOLOFT) 50 MG tablet Take 100 mg by mouth  at bedtime.  10/27/16  Yes [provider]  fluticasone (FLONASE) 50 MCG/ACT nasal spray Place 2 sprays into both nostrils daily. Patient not taking: Reported on 06/24/2017 11/17/16   Nche, Bonna Gains, NP  guaiFENesin-dextromethorphan (ROBITUSSIN DM) 100-10 MG/5ML syrup Take 5 mLs by mouth every 4 (four) hours as needed for cough. Patient not taking: Reported on 12/14/2016 11/17/16   Nche, Bonna Gains, NP  VIBERZI 100 MG TABS TAKE 1 TABLET TWICE A DAY Patient not taking: Reported on 06/24/2017 03/20/17   Evaristo Bury, NP    Family History Family History  Problem Relation Age of Onset  . Drug abuse Mother   . Kidney cancer Mother   . Bladder Cancer Mother   . Drug abuse Father   . Stroke Father   . Hypertension Father   . Arthritis Maternal Grandmother   . Diabetes Paternal Grandmother     Social  History Social History   Tobacco Use  . Smoking status: Current Every Day Smoker    Packs/day: 1.00    Years: 10.00    Pack years: 10.00    Types: E-cigarettes    Last attempt to quit: 02/16/2014    Years since quitting: 3.3  . Smokeless tobacco: Never Used  Substance Use Topics  . Alcohol use: No    Alcohol/week: 0.0 oz  . Drug use: No     Allergies   Patient has no known allergies.   Review of Systems Review of Systems  Genitourinary: Positive for vaginal bleeding.  All other systems reviewed and are negative.    Physical Exam Updated Vital Signs BP 117/78 (BP Location: Left Arm)   Pulse 76   Temp 98.3 F (36.8 C) (Oral)   Resp 16   Ht  (1.575 m)   Wt 62.6 kg (138 lb)   SpO2 96%   BMI 25.24 kg/m   Physical Exam  Nursing note and vitals reviewed.  27 year old female, resting comfortably and in no acute distress. Vital signs are normal. Oxygen saturation is 96%, which is normal. Head is normocephalic and atraumatic. PERRLA, EOMI. Oropharynx is clear. Neck is nontender and supple without adenopathy or JVD. Back is nontender and there is no CVA tenderness. Lungs are clear without rales, wheezes, or rhonchi. Chest is nontender. Heart has regular rate and rhythm with 2/6 systolic ejection murmur heard along the left sternal border. Abdomen is soft, flat, nontender without masses or hepatosplenomegaly and peristalsis is normoactive. Pelvic: Normal external female genitalia.  Cervix is closed.  Small amount of blood present in the vaginal vault without any clots present.  Fundus is retroverted, difficult to assess size.  No adnexal masses or tenderness.  No cervical motion tenderness. Extremities have no cyanosis or edema, full range of motion is present. Skin is warm and dry without rash. Neurologic: Mental status is normal, cranial nerves are intact, there are no motor or sensory deficits.  ED Treatments / Results  Labs (all labs ordered are listed, but only  abnormal results are displayed) Labs Reviewed  HCG, QUANTITATIVE, PREGNANCY - Abnormal; Notable for the following components:      Result Value   hCG, Beta Chain, Quant, S 232,556 (*)    All other components within normal limits  CBC WITH DIFFERENTIAL/PLATELET - Abnormal; Notable for the following components:   WBC 13.2 (*)    Neutro Abs 9.8 (*)    All other components within normal limits  RPR  HIV ANTIBODY (ROUTINE TESTING)  ABO/RH  GC/CHLAMYDIA  PROBE AMP ([place]) NOT AT [facility]   Radiology US Ob Comp Less 14 Wks  Result Date: 06/24/2017 CLINICAL DATA:  Vaginal bleeding EXAM: OBSTETRIC <14 WK ULTRASOUND TECHNIQUE: Transabdominal ultrasound was performed for evaluation of the gestation as well as the maternal uterus and adnexal regions. COMPARISON:  06/04/2017 FINDINGS: Intrauterine gestational sac: Single intrauterine gestation Yolk sac:  Visible Embryo:  Visible Cardiac Activity: Visible Heart Rate: 174 bpm CRL: 27.1 mm   9 w 4 d                  [place] EDC: 01/23/2018 Subchorionic hemorrhage:  Moderate subchorionic hemorrhage. Maternal uterus/adnexae: Left ovary measures 3.6 x 2 x 2.9 cm. Right ovary measures 4.6 x 2.6 x 2.4 cm. Probable hypoechoic luteal body in the right ovary measuring 2.4 x 1.9 x 1.4 cm. No significant free fluid IMPRESSION: 1. Single viable intrauterine pregnancy as above 2. Moderate subchorionic hemorrhage Electronically Signed   By: Jasmine Pang M.D.   On: 06/24/2017 03:43    Procedures Procedures   Medications Ordered in ED Medications  acetaminophen (TYLENOL) tablet 650 mg (650 mg Oral Given 06/24/17 0313)     Initial Impression / Assessment and Plan / ED Course  I have reviewed the triage vital signs and the nursing notes.  Pertinent labs & imaging results that were available during my care of the patient were reviewed by me and considered in my medical decision making (see chart for details).  First trimester bleeding.  hCG level is over 200,000.  Old  records are reviewed, and I see no relevant past visits, and can find no record of blood type in our system or in care everywhere.  Will check blood type and get baseline hemoglobin.  She will be sent for pelvic ultrasound.  Hemoglobin is come back normal.  Ultrasound shows subchorionic hemorrhage.  I have explained this to the patient.  Blood type is O+, not a RhoGam candidate.  She is discharged with threatened miscarriage instructions, follow-up with her obstetrician in 3 days.  Final Clinical Impressions(s) / ED Diagnoses   Final diagnoses:  First trimester bleeding  Subchorionic hemorrhage of placenta in first trimester, single or unspecified fetus    ED Discharge Orders    None       Dione Booze, MD 06/24/17 [phone]

## 2017-06-26 LAB — GC/CHLAMYDIA PROBE AMP (~~LOC~~) NOT AT ARMC
Chlamydia: NEGATIVE
NEISSERIA GONORRHEA: NEGATIVE

## 2017-06-28 ENCOUNTER — Encounter: Payer: Self-pay | Admitting: Family

## 2017-06-28 ENCOUNTER — Encounter: Payer: Self-pay | Admitting: Nurse Practitioner

## 2018-09-06 ENCOUNTER — Ambulatory Visit: Payer: Self-pay | Admitting: Family Medicine

## 2019-05-16 IMAGING — US US OB COMP LESS 14 WK
1 series · 14 of 28 positions shown · non-contrast
Comparison: 06/04/2017

CLINICAL DATA: Vaginal bleeding

EXAM:
OBSTETRIC <14 WK ULTRASOUND
TECHNIQUE: Transabdominal ultrasound was performed for evaluation of the
gestation as well as the maternal uterus and adnexal regions.

[Series 1: us ob comp less 14 wk · 14 of 69 slices shown]
[im 3/69]
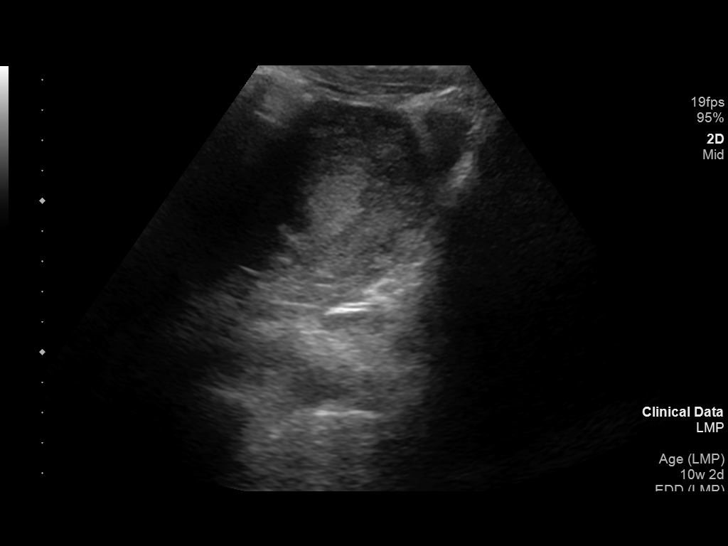
[im 8/69]
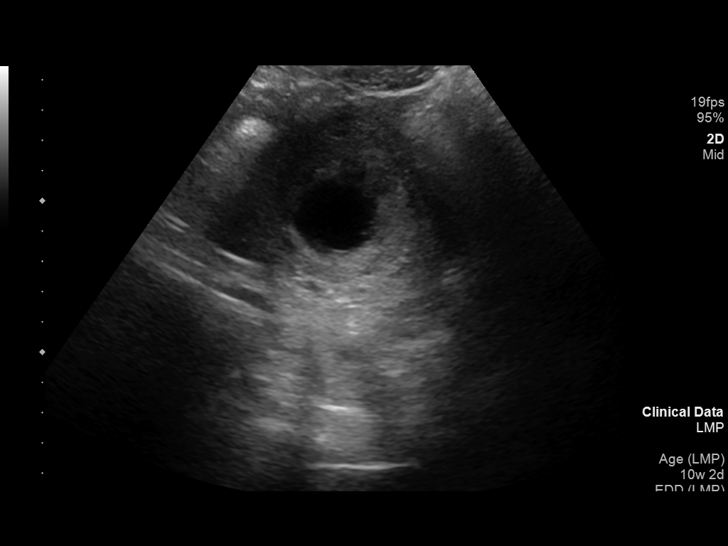
[im 13/69]
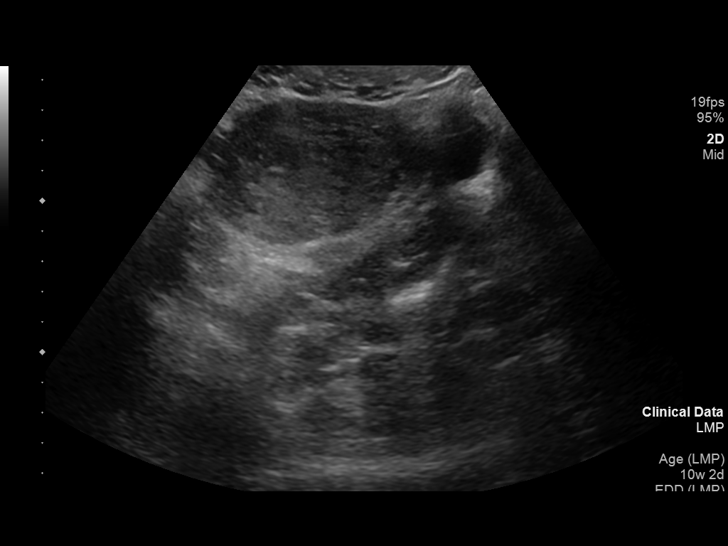
[im 18/69]
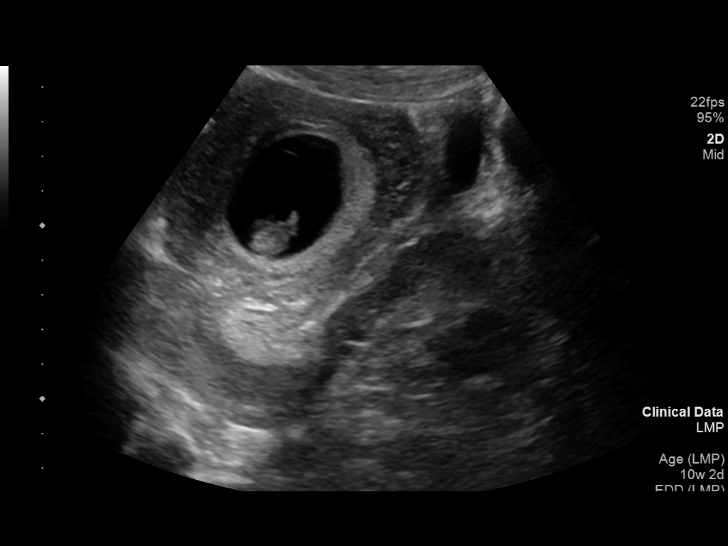
[im 23/69]
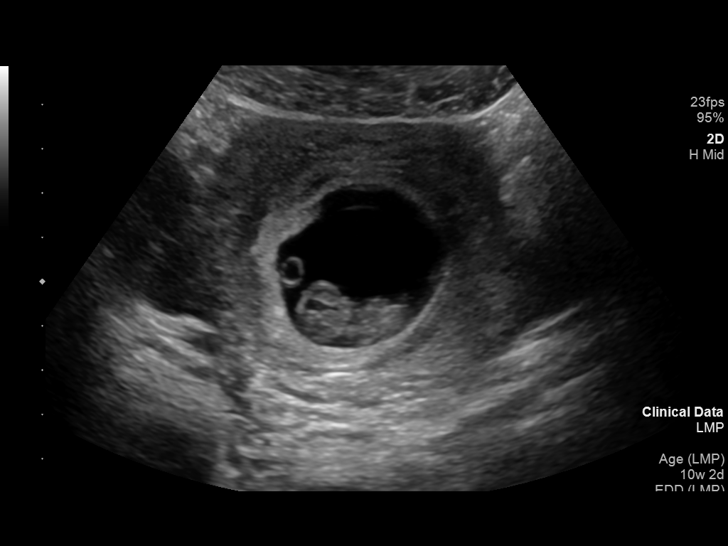
[im 28/69]
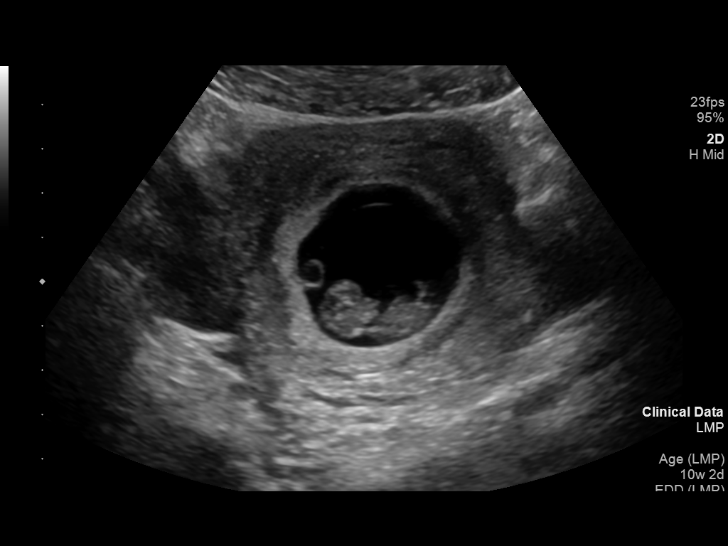
[im 33/69]
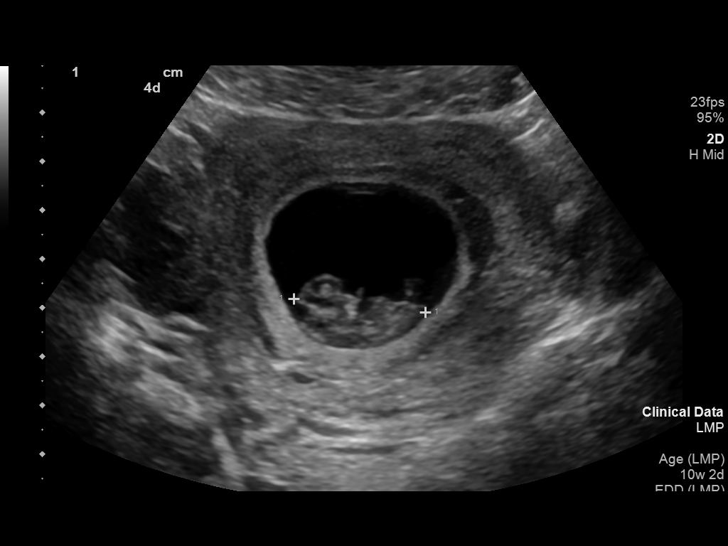
[im 38/69]
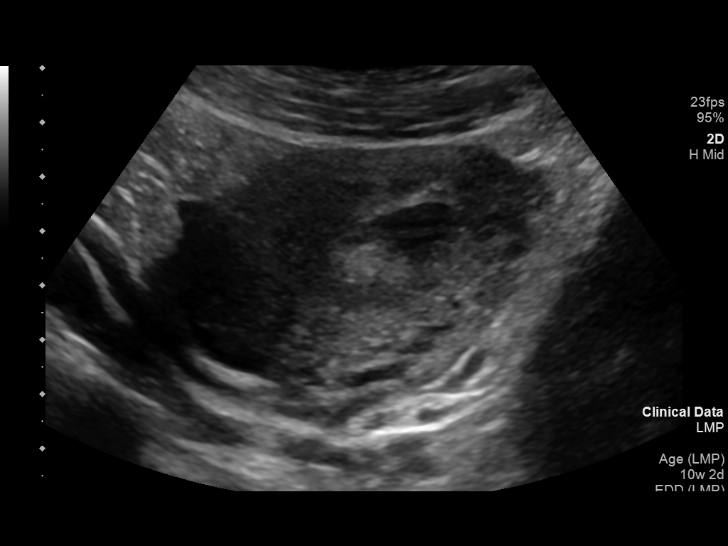
[im 43/69]
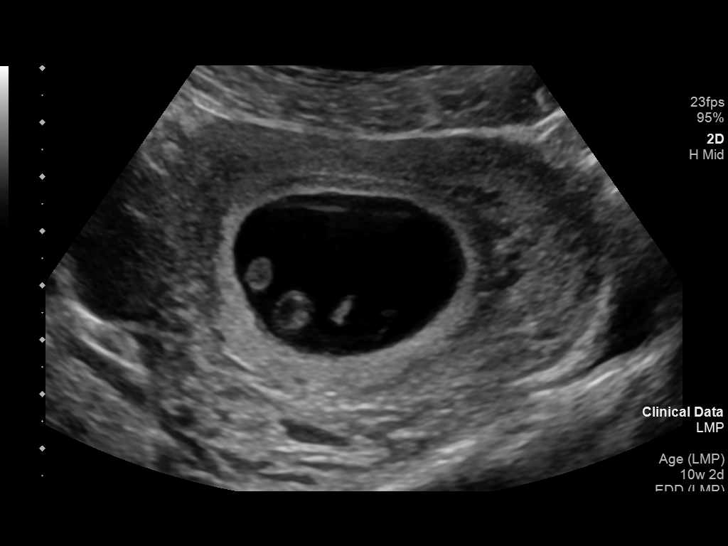
[im 48/69]
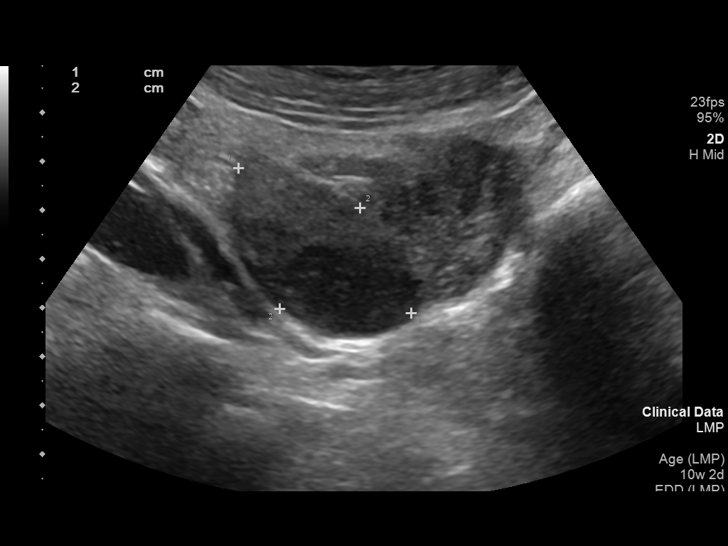
[im 53/69]
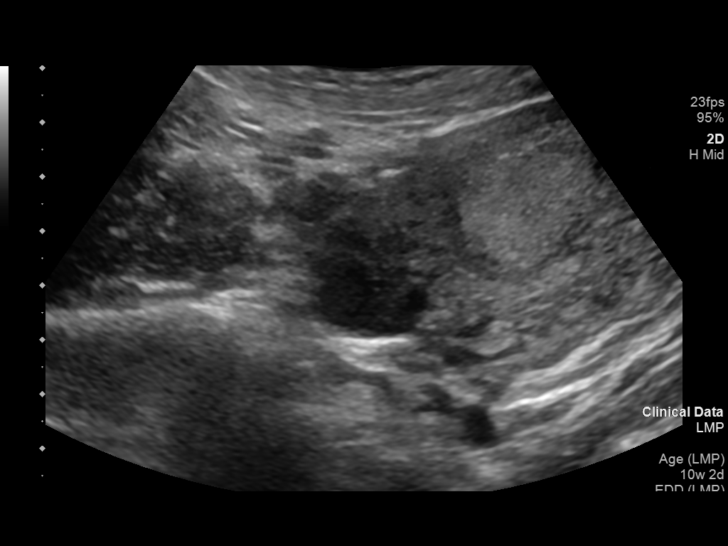
[im 58/69]
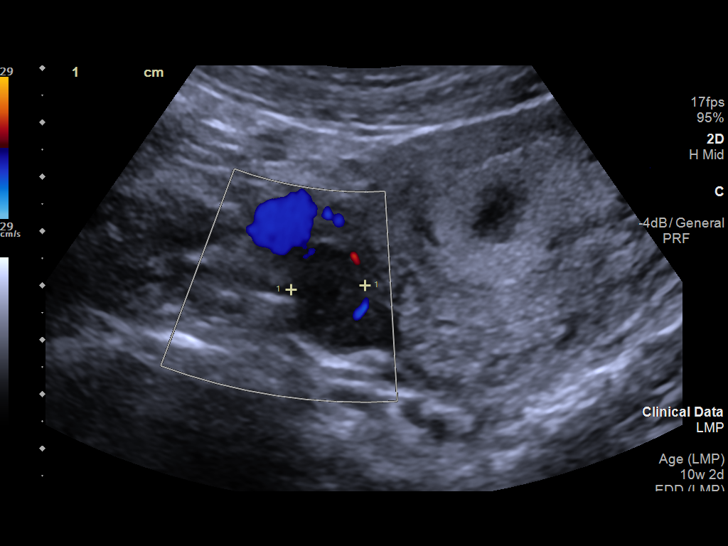
[im 63/69]
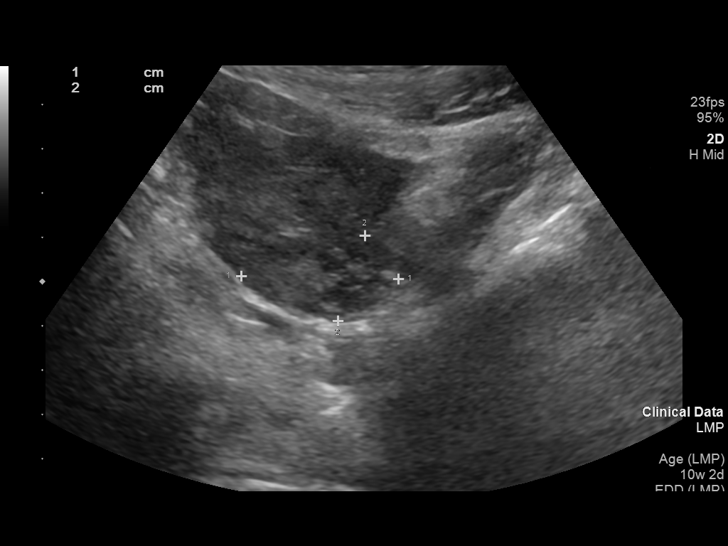
[im 69/69]
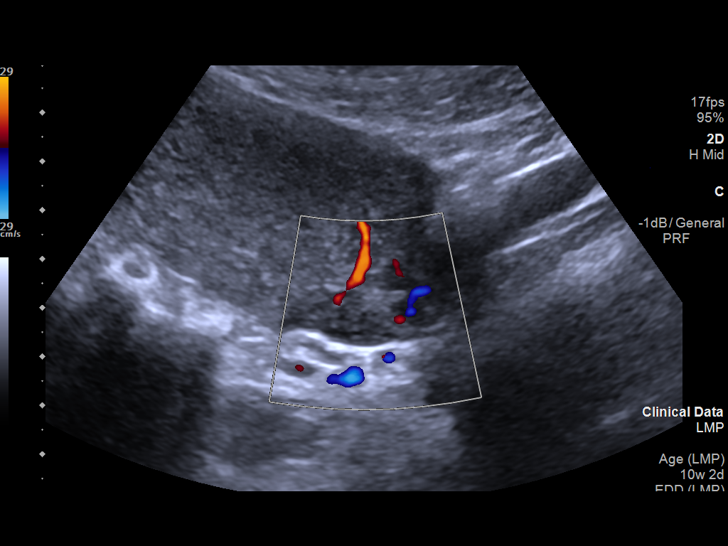

[14 of 28 positions shown; findings below may reference images not displayed]

FINDINGS: Intrauterine gestational sac: Single intrauterine gestation

Yolk sac:  Visible

Embryo:  Visible

Cardiac Activity: Visible

Heart Rate: 174 bpm

CRL: 27.1 mm   9 w 4 d                  US EDC: 01/23/2018

Subchorionic hemorrhage:  Moderate subchorionic hemorrhage.

Maternal uterus/adnexae: Left ovary measures 3.6 x 2 x 2.9 cm. Right
ovary measures 4.6 x 2.6 x 2.4 cm. Probable hypoechoic luteal body
in the right ovary measuring 2.4 x 1.9 x 1.4 cm. No significant free
fluid
IMPRESSION: 1. Single viable intrauterine pregnancy as above
2. Moderate subchorionic hemorrhage

## 2021-07-12 ENCOUNTER — Encounter (HOSPITAL_BASED_OUTPATIENT_CLINIC_OR_DEPARTMENT_OTHER): Payer: Self-pay | Admitting: Emergency Medicine

## 2021-07-12 ENCOUNTER — Other Ambulatory Visit: Payer: Self-pay

## 2021-07-12 ENCOUNTER — Emergency Department (HOSPITAL_BASED_OUTPATIENT_CLINIC_OR_DEPARTMENT_OTHER): Payer: Medicaid Other

## 2021-07-12 ENCOUNTER — Emergency Department (HOSPITAL_BASED_OUTPATIENT_CLINIC_OR_DEPARTMENT_OTHER)
Admission: EM | Admit: 2021-07-12 | Discharge: 2021-07-12 | Disposition: A | Discharge status: Home / Self Care | Payer: Medicaid Other | Attending: Emergency Medicine | Admitting: Emergency Medicine

## 2021-07-12 DIAGNOSIS — N9489 Other specified conditions associated with female genital organs and menstrual cycle: Secondary | ICD-10-CM | POA: Insufficient documentation

## 2021-07-12 DIAGNOSIS — A084 Viral intestinal infection, unspecified: Secondary | ICD-10-CM | POA: Diagnosis not present

## 2021-07-12 DIAGNOSIS — F17210 Nicotine dependence, cigarettes, uncomplicated: Secondary | ICD-10-CM | POA: Insufficient documentation

## 2021-07-12 DIAGNOSIS — E876 Hypokalemia: Secondary | ICD-10-CM | POA: Insufficient documentation

## 2021-07-12 DIAGNOSIS — J45909 Unspecified asthma, uncomplicated: Secondary | ICD-10-CM | POA: Insufficient documentation

## 2021-07-12 DIAGNOSIS — R197 Diarrhea, unspecified: Secondary | ICD-10-CM | POA: Diagnosis present

## 2021-07-12 LAB — CBC WITH DIFFERENTIAL/PLATELET
Abs Immature Granulocytes: 0.02 10*3/uL (ref 0.00–0.07)
Basophils Absolute: 0 10*3/uL (ref 0.0–0.1)
Basophils Relative: 0 %
Eosinophils Absolute: 0.1 10*3/uL (ref 0.0–0.5)
Eosinophils Relative: 1 %
HCT: 39.5 % (ref 36.0–46.0)
Hemoglobin: 14.3 g/dL (ref 12.0–15.0)
Immature Granulocytes: 0 %
Lymphocytes Relative: 10 %
Lymphs Abs: 0.9 10*3/uL (ref 0.7–4.0)
MCH: 31.1 pg (ref 26.0–34.0)
MCHC: 36.2 g/dL — ABNORMAL HIGH (ref 30.0–36.0)
MCV: 85.9 fL (ref 80.0–100.0)
Monocytes Absolute: 0.5 10*3/uL (ref 0.1–1.0)
Monocytes Relative: 6 %
Neutro Abs: 7.5 10*3/uL (ref 1.7–7.7)
Neutrophils Relative %: 83 %
Platelets: 191 10*3/uL (ref 150–400)
RBC: 4.6 MIL/uL (ref 3.87–5.11)
RDW: 12.4 % (ref 11.5–15.5)
WBC: 9.1 10*3/uL (ref 4.0–10.5)
nRBC: 0 % (ref 0.0–0.2)

## 2021-07-12 LAB — COMPREHENSIVE METABOLIC PANEL
ALT: 26 U/L (ref 0–44)
AST: 27 U/L (ref 15–41)
Albumin: 4.2 g/dL (ref 3.5–5.0)
Alkaline Phosphatase: 64 U/L (ref 38–126)
Anion gap: 13 (ref 5–15)
BUN: 15 mg/dL (ref 6–20)
CO2: 22 mmol/L (ref 22–32)
Calcium: 9 mg/dL (ref 8.9–10.3)
Chloride: 100 mmol/L (ref 98–111)
Creatinine, Ser: 0.66 mg/dL (ref 0.44–1.00)
GFR, Estimated: >60 mL/min (ref >60)
Glucose, Bld: 105 mg/dL — ABNORMAL HIGH (ref 70–99)
Potassium: 2.7 mmol/L — CL (ref 3.5–5.1)
Sodium: 135 mmol/L (ref 135–145)
Total Bilirubin: 0.6 mg/dL (ref 0.3–1.2)
Total Protein: 7.3 g/dL (ref 6.5–8.1)

## 2021-07-12 LAB — URINALYSIS, ROUTINE W REFLEX MICROSCOPIC
Bilirubin Urine: NEGATIVE
Glucose, UA: NEGATIVE mg/dL
Hgb urine dipstick: NEGATIVE
Ketones, ur: NEGATIVE mg/dL
Leukocytes,Ua: NEGATIVE
Nitrite: NEGATIVE
Protein, ur: NEGATIVE mg/dL
Specific Gravity, Urine: 1.012 (ref 1.005–1.030)
pH: 6 (ref 5.0–8.0)

## 2021-07-12 LAB — HCG, SERUM, QUALITATIVE: Preg, Serum: NEGATIVE

## 2021-07-12 MED ORDER — ONDANSETRON HCL 4 MG PO TABS: 4.0000 mg | ORAL_TABLET | Freq: Four times a day (QID) | ORAL | 0 refills | Status: AC

## 2021-07-12 MED ORDER — DICYCLOMINE HCL 10 MG PO CAPS
10.0000 mg | ORAL_CAPSULE | Freq: Once | ORAL | Status: AC
  Administered 2021-07-12: 10 mg via ORAL
  Filled 2021-07-12: qty 1

## 2021-07-12 MED ORDER — POTASSIUM CHLORIDE 10 MEQ/100ML IV SOLN
10.0000 meq | Freq: Once | INTRAVENOUS | Status: AC
  Administered 2021-07-12: 10 meq via INTRAVENOUS
  Filled 2021-07-12: qty 100

## 2021-07-12 MED ORDER — LACTATED RINGERS IV BOLUS
1000.0000 mL | Freq: Once | INTRAVENOUS | Status: AC
  Administered 2021-07-12: 1000 mL via INTRAVENOUS

## 2021-07-12 MED ORDER — LIDOCAINE VISCOUS HCL 2 % MT SOLN
15.0000 mL | Freq: Once | OROMUCOSAL | Status: AC
  Administered 2021-07-12: 15 mL via ORAL
  Filled 2021-07-12: qty 15

## 2021-07-12 MED ORDER — POTASSIUM CHLORIDE CRYS ER 20 MEQ PO TBCR
40.0000 meq | EXTENDED_RELEASE_TABLET | Freq: Once | ORAL | Status: AC
  Administered 2021-07-12: 40 meq via ORAL
  Filled 2021-07-12: qty 2

## 2021-07-12 MED ORDER — LOPERAMIDE HCL 2 MG PO CAPS
4.0000 mg | ORAL_CAPSULE | Freq: Once | ORAL | Status: AC
  Administered 2021-07-12: 4 mg via ORAL
  Filled 2021-07-12: qty 2

## 2021-07-12 MED ORDER — ALUM & MAG HYDROXIDE-SIMETH 200-200-20 MG/5ML PO SUSP
30.0000 mL | Freq: Once | ORAL | Status: AC
  Administered 2021-07-12: 30 mL via ORAL
  Filled 2021-07-12: qty 30

## 2021-07-12 MED ORDER — ONDANSETRON HCL 4 MG/2ML IJ SOLN
4.0000 mg | Freq: Once | INTRAMUSCULAR | Status: AC
  Administered 2021-07-12: 4 mg via INTRAVENOUS
  Filled 2021-07-12: qty 2

## 2021-07-12 MED ORDER — IOHEXOL 300 MG/ML  SOLN
100.0000 mL | Freq: Once | INTRAMUSCULAR | Status: AC | PRN
Start: 2021-07-12 — End: 2021-07-12
  Administered 2021-07-12: 100 mL via INTRAVENOUS

## 2021-07-12 NOTE — ED Triage Notes (Signed)
Pt arrives to ED with c/o emesis and diarrhea. Pt reports that she went out for her birthday on 6[birth year]10 and woke up the next morning with vomiting and diarrhea. These symptoms have been constant since. She does reports both her kids have similar symptoms.

## 2021-07-12 NOTE — ED Notes (Signed)
Patient given discharge instructions, all questions answered. Patient in possession of all belongings, directed to the discharge area  

## 2021-07-12 NOTE — ED Provider Notes (Signed)
[facility] EMERGENCY DEPT Provider Note   CSN: [id number] Arrival date & time: 07/12/21  1211     History  Chief Complaint  Patient presents with   Emesis   Diarrhea    Joyce Rivas is a 31 y.o. female who presents emergency department for evaluation of vomiting and diarrhea for 3 to 4 days.  Patient states that both of her children have recently been ill with similar symptoms although they recovered in about 1 or 2 days each.  She went out drinking a couple of days ago for her birthday and initially thought her symptoms were related to a hangover.  However her diarrhea and vomiting continue.  She is having about 10 episodes of diarrhea per day.  She had about 10 episodes of vomiting yesterday although notes that she has not had any today.  She also has discomfort across the lower abdomen described as cramping.  Patient states that she is unable to keep food down but is able to drink water.  She has been drinking large amounts of water to compensate for her fluid loss but notes that she has not urinated since yesterday morning.  She has not had the urge to urinate.  She has tried Pepto-Bismol without relief.  She denies fevers, chest pain, shortness of breath.   Emesis Associated symptoms: diarrhea   Diarrhea Associated symptoms: vomiting        Home Medications Prior to Admission medications   Medication Sig Start Date End Date Taking? Authorizing Provider  acyclovir (ZOVIRAX) 400 MG tablet Take 1 tablet (400 mg total) by mouth 2 (two) times daily. 11/21/16  Yes Nche, Charlene Brooke, NP  gabapentin (NEURONTIN) 400 MG capsule Take 400 mg by mouth 3 (three) times daily.   Yes [provider]  mirtazapine (REMERON) 15 MG tablet Take 15 mg by mouth at bedtime. 06/26/21  Yes [provider]  naltrexone (DEPADE) 50 MG tablet Take 50 mg by mouth daily. 06/26/21  Yes [provider]  ondansetron (ZOFRAN) 4 MG tablet Take 1 tablet (4 mg total) by mouth  every 6 (six) hours. 07/12/21  Yes Kathe Becton R, PA-C  prazosin (MINIPRESS) 1 MG capsule Take 1 mg by mouth at bedtime. 06/04/21  Yes [provider]  prazosin (MINIPRESS) 5 MG capsule Take 5 mg by mouth at bedtime. 06/29/21  Yes [provider]  Prenat-Fe Poly-Methfol-FA-DHA (VITAFOL ULTRA) 29-0.6-0.4-200 MG CAPS Take 1 capsule by mouth daily. 06/28/21  Yes [provider]  propranolol (INDERAL) 10 MG tablet Take 1 tablet (10 mg total) by mouth 3 (three) times daily. Must keep appt w/new provider for future refills 05/01/17  Yes Dragnev, Caesar Chestnut, NP  QUEtiapine (SEROQUEL) 50 MG tablet Take 50 mg by mouth at bedtime. 06/29/21  Yes [provider]  venlafaxine XR (EFFEXOR-XR) 75 MG 24 hr capsule Take 225 mg by mouth daily. 06/01/21  Yes [provider]  VIBERZI 100 MG TABS Take 100 mg by mouth 2 (two) times daily. 07/08/21  Yes [provider]  albuterol (PROVENTIL HFA;VENTOLIN HFA) 108 (90 Base) MCG/ACT inhaler Inhale 1-2 puffs into the lungs every 6 (six) hours as needed. Patient taking differently: Inhale 1-2 puffs into the lungs every 6 (six) hours as needed for wheezing or shortness of breath.  11/17/16   Nche, Charlene Brooke, NP      Allergies    Patient has no known allergies.    Review of Systems   Review of Systems  Gastrointestinal:  Positive for  diarrhea and vomiting.    Physical Exam Updated Vital Signs BP (!) 121/92   Pulse 89   Temp 98.5 F (36.9 C) (Oral)   Resp 16   Ht 5\' 2"  (1.575 m)   Wt 77.1 kg   SpO2 98%   BMI 31.09 kg/m  Physical Exam Vitals and nursing note reviewed.  Constitutional:      General: She is not in acute distress.    Appearance: She is ill-appearing.  HENT:     Head: Atraumatic.  Eyes:     Conjunctiva/sclera: Conjunctivae normal.  Cardiovascular:     Rate and Rhythm: Normal rate and regular rhythm.     Pulses: Normal pulses.          Radial pulses are 2+ on the right side and 2+ on the  left side.       Dorsalis pedis pulses are 2+ on the right side and 2+ on the left side.     Heart sounds: No murmur heard. Pulmonary:     Effort: Pulmonary effort is normal. No respiratory distress.     Breath sounds: Normal breath sounds.  Abdominal:     General: Abdomen is flat. There is no distension.     Palpations: Abdomen is soft.     Tenderness: There is abdominal tenderness in the right lower quadrant. There is no right CVA tenderness or left CVA tenderness.  Musculoskeletal:        General: Normal range of motion.     Cervical back: Normal range of motion.  Skin:    General: Skin is warm and dry.     Capillary Refill: Capillary refill takes less than 2 seconds.  Neurological:     General: No focal deficit present.     Mental Status: She is alert.  Psychiatric:        Mood and Affect: Mood normal.     ED Results / Procedures / Treatments   Labs (all labs ordered are listed, but only abnormal results are displayed) Labs Reviewed  COMPREHENSIVE METABOLIC PANEL - Abnormal; Notable for the following components:      Result Value   Potassium 2.7 (*)    Glucose, Bld 105 (*)    All other components within normal limits  CBC WITH DIFFERENTIAL/PLATELET - Abnormal; Notable for the following components:   MCHC 36.2 (*)    All other components within normal limits  URINALYSIS, ROUTINE W REFLEX MICROSCOPIC  HCG, SERUM, QUALITATIVE    EKG None  Radiology CT ABDOMEN PELVIS W CONTRAST  Result Date: 07/12/2021 CLINICAL DATA:  Right lower quadrant abdominal pain, vomiting, diarrhea for 2 days EXAM: CT ABDOMEN AND PELVIS WITH CONTRAST TECHNIQUE: Multidetector CT imaging of the abdomen and pelvis was performed using the standard protocol following bolus administration of intravenous contrast. RADIATION DOSE REDUCTION: This exam was performed according to the departmental dose-optimization program which includes automated exposure control, adjustment of the mA and/or kV according to  patient size and/or use of iterative reconstruction technique. CONTRAST:  145mL OMNIPAQUE IOHEXOL 300 MG/ML  SOLN COMPARISON:  None Available. FINDINGS: Lower chest: No acute abnormality. Hepatobiliary: No solid liver abnormality is seen. Hepatic steatosis. No gallstones, gallbladder wall thickening, or biliary dilatation. Pancreas: Unremarkable. No pancreatic ductal dilatation or surrounding inflammatory changes. Spleen: Splenomegaly, maximum coronal span 15.0 cm. Adrenals/Urinary Tract: Adrenal glands are unremarkable. Kidneys are normal, without renal calculi, solid lesion, or hydronephrosis. Bladder is unremarkable. Stomach/Bowel: Stomach is within normal limits. Appendix is not clearly visualized and may be  diminutive or surgically absent. No evidence of bowel wall thickening, distention, or inflammatory changes. Moderate burden of stool throughout the colon. Vascular/Lymphatic: No significant vascular findings are present. No enlarged abdominal or pelvic lymph nodes. Reproductive: No mass or other significant abnormality. IUD in the fundal endometrial cavity. Small bilateral functional ovarian follicles. Other: No abdominal wall hernia or abnormality. No ascites. Musculoskeletal: No acute or significant osseous findings. IMPRESSION: 1. No acute CT findings of the abdomen or pelvis to explain right lower quadrant abdominal pain. 2. Appendix is not clearly visualized and may be diminutive or surgically absent. No secondary inflammatory findings or fluid in the right lower quadrant. 3. Hepatic steatosis. 4. Splenomegaly. Electronically Signed   By: Delanna Ahmadi M.D.   On: 07/12/2021 15:00    Procedures Procedures    Medications Ordered in ED Medications  ondansetron (ZOFRAN) injection 4 mg (4 mg Intravenous Given 07/12/21 1355)  alum & mag hydroxide-simeth (MAALOX/MYLANTA) 200-200-20 MG/5ML suspension 30 mL (30 mLs Oral Given 07/12/21 1351)    And  lidocaine (XYLOCAINE) 2 % viscous mouth solution 15 mL  (15 mLs Oral Given 07/12/21 1351)  dicyclomine (BENTYL) capsule 10 mg (10 mg Oral Given 07/12/21 1351)  lactated ringers bolus 1,000 mL (0 mLs Intravenous Stopped 07/12/21 1430)  iohexol (OMNIPAQUE) 300 MG/ML solution 100 mL (100 mLs Intravenous Contrast Given 07/12/21 1443)  potassium chloride 10 mEq in 100 mL IVPB (0 mEq Intravenous Stopped 07/12/21 1615)  potassium chloride SA (KLOR-CON M) CR tablet 40 mEq (40 mEq Oral Given 07/12/21 1458)  loperamide (IMODIUM) capsule 4 mg (4 mg Oral Given 07/12/21 1634)    ED Course/ Medical Decision Making/ A&P                           Medical Decision Making Amount and/or Complexity of Data Reviewed Labs: ordered. Radiology: ordered.  Risk OTC drugs. Prescription drug management.   Social determinants of health:  Social History   Socioeconomic History   Marital status: Single    Spouse name: Not on file   Number of children: 0   Years of education: 11   Highest education level: Not on file  Occupational History   Occupation: Scientist, product/process development   Tobacco Use   Smoking status: Every Day    Packs/day: 1.00    Years: 10.00    Total pack years: 10.00    Types: E-cigarettes, Cigarettes    Last attempt to quit: 02/16/2014    Years since quitting: 7.4   Smokeless tobacco: Never  Vaping Use   Vaping Use: Every day  Substance and Sexual Activity   Alcohol use: No    Alcohol/week: 0.0 standard drinks of alcohol   Drug use: No   Sexual activity: Yes    Partners: Male  Other Topics Concern   Not on file  Social History Narrative   Born in [place], [place] and raised in [place], [place]. Lives with her husband. No pets. Fun: Dance and sing.   Denies any religious beliefs effecting healthcare.    Social Determinants of Health   Financial Resource Strain: Not on file  Food Insecurity: Not on file  Transportation Needs: Not on file  Physical Activity: Not on file  Stress: Not on file  Social Connections: Not on file  Intimate Partner Violence: Not on  file     Initial impression:  This patient presents to the ED for concern of diarrhea and vomiting, this involves an extensive number of treatment options,  and is a complaint that carries with it a high risk of complications and morbidity.   Differentials include viral gastroenteritis, pregnancy, appendicitis, infectious diarrhea.   Comorbidities affecting care:  Asthma  Additional history obtained: None  Lab Tests  I Ordered, reviewed, and interpreted labs and EKG.  The pertinent results include:  Potassium 2.7 CBC without leukocytosis Negative pregnancy UA normal  Imaging Studies ordered:  I ordered imaging studies including  CT abdomen without acute findings.  Bladder unremarkable I independently visualized and interpreted imaging and I agree with the radiologist interpretation.   EKG: Sinus rhythm  Cardiac Monitoring:  The patient was maintained on a cardiac monitor.  I personally viewed and interpreted the cardiac monitored which showed an underlying rhythm of: Sinus rhythm   Medicines ordered and prescription drug management:  I ordered medication including: GI cocktail Bentyl LR bolus Zofran 4 mg Imodium 4 mg Potassium 40 mEq p.o. Potassium 10 mEq IV Reevaluation of the patient after these medicines showed that the patient improved I have reviewed the patients home medicines and have made adjustments as needed    ED Course/Re-evaluation: 31 year old female presents to the ED for evaluation of 3 days of severe vomiting and diarrhea.  Vitals without acute abnormality.  On exam, she is mildly ill-appearing although nontoxic.  Lungs CTA bilaterally.  She has diffuse abdominal discomfort, but focal tenderness in the right lower quadrant.  Equivocal Murphy sign.  Negative CVA tenderness bilaterally.  Given this, I obtain CT abdomen pelvis which was without acute findings.  Labs were also reassuring although her potassium is critically low at 2.7, likely from  severe fluid loss.  EKG was obtained which was normal.  Patient was given 40 mEq p.o. potassium and 10 mEq IV.  She was medically managed with IV fluid bolus, GI cocktail with Bentyl, Zofran and Imodium with some improvement in symptoms.  I was concerned that she states she had not had not urinated since yesterday morning.  Bladder was unremarkable on CT scan and after fluids, patient passed normal urine. On reevaluation, patient is feeling much better.  Plan to discharge home with Zofran.  Imodium to be taken OTC.  Continue to push fluids.  Follow-up with PCP within a week to recheck potassium.  Patient expresses understanding is amenable to plan.  Return precautions discussed.  Disposition:  After consideration of the diagnostic results, physical exam, history and the patients response to treatment feel that the patent would benefit from discharge.   Viral gastroenteritis Hypokalemia: Plan and management as described above. Discharged home in good condition.   Final diagnoses:  Viral gastroenteritis  Hypokalemia    Rx / DC Orders ED Discharge Orders          Ordered    ondansetron (ZOFRAN) 4 MG tablet  Every 6 hours        07/12/21 1700              Tonye Pearson, PA-C [id number]    Lianne Cure, DO [id number]

## 2021-07-12 NOTE — Discharge Instructions (Addendum)
Your symptoms are likely due to a viral gastroenteritis.  Overall your labs and imaging were very reassuring although your potassium was very low.  This is because you have been vomiting and having such severe diarrhea.  We have reinfused you with potassium, but I would like you to follow-up with your PCP within the week to have that rechecked.  I have also sent you in some medication for nausea and vomiting.  You can take Imodium at home for your diarrhea.  Continue to drink plenty of water.  Return if you develop palpitations, fevers or worsening symptoms.

## 2021-07-12 NOTE — ED Notes (Signed)
Patient c/o diarrhea and vomiting for 3 days. States the vomiting has stopped, but diarrhea continues. States she feels dehydrated.
